# Patient Record
Sex: Female | Born: 1955 | Race: White | Hispanic: No | Marital: Married | Smoking: Former smoker
Health system: Southern US, Community
[De-identification: ages and names within clinical notes are randomized; demographics above are authoritative.]

## PROBLEM LIST (undated history)

## (undated) DIAGNOSIS — C801 Malignant (primary) neoplasm, unspecified: Secondary | ICD-10-CM

## (undated) DIAGNOSIS — E785 Hyperlipidemia, unspecified: Secondary | ICD-10-CM

## (undated) HISTORY — DX: Malignant (primary) neoplasm, unspecified: C80.1

## (undated) HISTORY — PX: ABDOMINAL HYSTERECTOMY: SHX81

## (undated) HISTORY — DX: Hyperlipidemia, unspecified: E78.5

## (undated) HISTORY — PX: JOINT REPLACEMENT: SHX530

---

## 2012-09-05 ENCOUNTER — Ambulatory Visit (INDEPENDENT_AMBULATORY_CARE_PROVIDER_SITE_OTHER): Payer: BC Managed Care – PPO | Admitting: Physician Assistant

## 2012-09-05 VITALS — BP 130/70 | HR 60 | Temp 97.7°F | Ht 71.0 in | Wt 164.0 lb

## 2012-09-05 DIAGNOSIS — J322 Chronic ethmoidal sinusitis: Secondary | ICD-10-CM

## 2012-09-05 DIAGNOSIS — R05 Cough: Secondary | ICD-10-CM

## 2012-09-05 MED ORDER — AZITHROMYCIN 250 MG PO TABS: ORAL_TABLET | ORAL | Status: DC

## 2012-09-05 MED ORDER — HYDROCODONE-HOMATROPINE 5-1.5 MG/5ML PO SYRP: 5.0000 mL | ORAL_SOLUTION | Freq: Every evening | ORAL | Status: DC | PRN

## 2012-09-05 NOTE — Progress Notes (Signed)
  Subjective:    Patient ID: Tracie Noble, female    DOB: 1956/05/29, 57 y.o.   MRN: [id number]  HPI Cough congestion x 2days   Review of Systems  HENT: Positive for congestion and sinus pressure.   Respiratory: Positive for cough.   All other systems reviewed and are negative.       Objective:   Physical Exam  Vitals reviewed. Constitutional: She is oriented to person, place, and time. She appears well-developed and well-nourished.  HENT:  Head: Normocephalic and atraumatic.  Right Ear: External ear normal.  Left Ear: External ear normal.  maxofacial tenderness, nasal hypertrophy  Eyes: Conjunctivae and EOM are normal. Pupils are equal, round, and reactive to light.  Cardiovascular: Normal rate, regular rhythm and normal heart sounds.   Pulmonary/Chest: Effort normal and breath sounds normal.  Dry cough  Neurological: She is alert and oriented to person, place, and time.  Skin: Skin is warm and dry.          Assessment & Plan:  Ethmoid sinusitis - Plan: azithromycin (ZITHROMAX) 250 MG tablet  Cough - Plan: HYDROcodone-homatropine (HYCODAN) 5-1.5 MG/5ML syrup

## 2017-08-22 ENCOUNTER — Ambulatory Visit: Payer: Managed Care, Other (non HMO) | Admitting: Family Medicine

## 2017-08-22 ENCOUNTER — Encounter: Payer: Self-pay | Admitting: Family Medicine

## 2017-08-22 VITALS — BP 113/74 | HR 69 | Temp 98.5°F | Ht 71.0 in | Wt 158.0 lb

## 2017-08-22 DIAGNOSIS — J0101 Acute recurrent maxillary sinusitis: Secondary | ICD-10-CM

## 2017-08-22 DIAGNOSIS — J040 Acute laryngitis: Secondary | ICD-10-CM

## 2017-08-22 MED ORDER — FLUTICASONE PROPIONATE 50 MCG/ACT NA SUSP: 1.0000 | Freq: Two times a day (BID) | NASAL | 6 refills | Status: DC | PRN

## 2017-08-22 MED ORDER — AMOXICILLIN 500 MG PO CAPS: 500.0000 mg | ORAL_CAPSULE | Freq: Two times a day (BID) | ORAL | 0 refills | Status: DC

## 2017-08-22 NOTE — Progress Notes (Signed)
BP 113/74   Pulse 69   Temp 98.5 F (36.9 C) (Oral)   Ht 5\' 11"  (1.803 m)   Wt 158 lb (71.7 kg)   BMI 22.04 kg/m    Subjective:    Patient ID: Tracie Noble, female    DOB: [birth year], 62 y.o.   MRN: [id number]  HPI: Tracie Noble is a 62 y.o. female presenting on 08/22/2017 for Sinusitis (sinus congestion, drainage, hoarseness; symptoms ongoing for about 2 weeks)   HPI Sinus congestion and loss of voice Patient is coming in for sinus congestion and loss of voice that has been going on for the past 2 weeks.  She normally gets sinus congestion but she does not get it as bad and with as much of a sore throat as she has currently or with the drainage that she is been having.  She denies any fevers or chills or shortness of breath or wheezing.  She does admit to a remote history of smoking but did not smoke very much.  She has been having a lot of mucus drainage and coughing and production.  She has not used anything over-the-counter because usually it just goes away on her.  When he got worse was a couple days ago and that is when she started having the sore throat really bad.  Relevant past medical, surgical, family and social history reviewed and updated as indicated. Interim medical history since our last visit reviewed. Allergies and medications reviewed and updated.  Review of Systems  Constitutional: Negative for chills and fever.  HENT: Positive for congestion, postnasal drip, rhinorrhea, sinus pressure, sneezing and sore throat. Negative for ear discharge and ear pain.   Eyes: Negative for pain, redness and visual disturbance.  Respiratory: Positive for cough. Negative for chest tightness, shortness of breath and wheezing.   Cardiovascular: Negative for chest pain and leg swelling.  Genitourinary: Negative for difficulty urinating and dysuria.  Musculoskeletal: Negative for back pain and gait problem.  Skin: Negative for rash.  Neurological: Negative for light-headedness and  headaches.  Psychiatric/Behavioral: Negative for agitation and behavioral problems.  All other systems reviewed and are negative.   Per HPI unless specifically indicated above  Social History   Socioeconomic History  . Marital status: Married    Spouse name: Not on file  . Number of children: Not on file  . Years of education: Not on file  . Highest education level: Not on file  Occupational History  . Not on file  Social Needs  . Financial resource strain: Not on file  . Food insecurity:    Worry: Not on file    Inability: Not on file  . Transportation needs:    Medical: Not on file    Non-medical: Not on file  Tobacco Use  . Smoking status: Former Smoker    Packs/day: 0.25    Years: 10.00    Pack years: 2.50    Last attempt to quit: 09/06/2002    Years since quitting: 14.9  . Smokeless tobacco: Never Used  Substance and Sexual Activity  . Alcohol use: No  . Drug use: No  . Sexual activity: Yes    Comment: married 7 years, on daughter and grankids  Lifestyle  . Physical activity:    Days per week: Not on file    Minutes per session: Not on file  . Stress: Not on file  Relationships  . Social connections:    Talks on phone: Not on file  Gets together: Not on file    Attends religious service: Not on file    Active member of club or organization: Not on file    Attends meetings of clubs or organizations: Not on file    Relationship status: Not on file  . Intimate partner violence:    Fear of current or ex partner: Not on file    Emotionally abused: Not on file    Physically abused: Not on file    Forced sexual activity: Not on file  Other Topics Concern  . Not on file  Social History Narrative  . Not on file    Past Surgical History:  Procedure Laterality Date  . ABDOMINAL HYSTERECTOMY    . JOINT REPLACEMENT     left- leg    Family History  Problem Relation Age of Onset  . Dementia Mother   . Cancer Father        lymph nodes, non hodgkins  lymphoma  . Diabetes Father     Allergies as of 08/22/2017   No Known Allergies     Medication List        Accurate as of 08/22/17 10:33 AM. Always use your most recent med list.          amoxicillin 500 MG capsule Commonly known as:  AMOXIL Take 1 capsule (500 mg total) by mouth 2 (two) times daily.   fluticasone 50 MCG/ACT nasal spray Commonly known as:  FLONASE Place 1 spray into both nostrils 2 (two) times daily as needed for allergies or rhinitis.          Objective:    BP 113/74   Pulse 69   Temp 98.5 F (36.9 C) (Oral)   Ht 5\' 11"  (1.803 m)   Wt 158 lb (71.7 kg)   BMI 22.04 kg/m   Wt Readings from Last 3 Encounters:  08/22/17 158 lb (71.7 kg)  09/05/12 164 lb (74.4 kg)    Physical Exam  Constitutional: She is oriented to person, place, and time. She appears well-developed and well-nourished. No distress.  HENT:  Right Ear: Tympanic membrane, external ear and ear canal normal.  Left Ear: Tympanic membrane, external ear and ear canal normal.  Nose: Mucosal edema and rhinorrhea present. No epistaxis. Right sinus exhibits no maxillary sinus tenderness and no frontal sinus tenderness. Left sinus exhibits no maxillary sinus tenderness and no frontal sinus tenderness.  Mouth/Throat: Uvula is midline and mucous membranes are normal. Posterior oropharyngeal edema and posterior oropharyngeal erythema present. No oropharyngeal exudate or tonsillar abscesses.  Eyes: Conjunctivae are normal.  Neck: Neck supple. No thyromegaly present.  Cardiovascular: Normal rate, regular rhythm, normal heart sounds and intact distal pulses.  No murmur heard. Pulmonary/Chest: Effort normal and breath sounds normal. No respiratory distress. She has no wheezes. She has no rales.  Musculoskeletal: Normal range of motion. She exhibits no edema or tenderness.  Lymphadenopathy:    She has no cervical adenopathy.  Neurological: She is alert and oriented to person, place, and time.  Coordination normal.  Skin: Skin is warm and dry. No rash noted. She is not diaphoretic.  Psychiatric: She has a normal mood and affect. Her behavior is normal.  Vitals reviewed.   No results found for this or any previous visit.    Assessment & Plan:   Problem List Items Addressed This Visit    None    Visit Diagnoses    Laryngitis    -  Primary   Relevant Medications   fluticasone (  FLONASE) 50 MCG/ACT nasal spray   amoxicillin (AMOXIL) 500 MG capsule   Acute recurrent maxillary sinusitis       Relevant Medications   fluticasone (FLONASE) 50 MCG/ACT nasal spray   amoxicillin (AMOXIL) 500 MG capsule       Follow up plan: Return if symptoms worsen or fail to improve.  Caryl Pina, MD [facility] 08/22/2017, 10:33 AM

## 2017-08-23 ENCOUNTER — Ambulatory Visit: Payer: Self-pay | Admitting: Family Medicine

## 2017-08-26 NOTE — Progress Notes (Signed)
Tracie Noble is a 62 y.o. female presents to office today to establish care and for annual physical exam examination.    Concerns today include: 1. None.  Has insurance form that needs to be completed.  Needs labs.  Occupation: Librarian, academic, Marital status: married, Substance use: former smoker, quit >20 years ago. Diet: regular, Exercise: regular, some structured walking Last eye exam: ? Last dental exam: ? Last colonoscopy: never.   Last mammogram: >2 years ago. No h/x abnormals Last pap smear: > 15 years ago.  She notes that she had a partial hysterectomy.  She is unsure if she still has her cervix. Refills needed today: Not on any medications. Immunizations needed: Needs TDap.  Past Medical History:  Diagnosis Date  . Cancer ([place])    cervical at age 22    Social History   Socioeconomic History  . Marital status: Married    Spouse name: Not on file  . Number of children: Not on file  . Years of education: Not on file  . Highest education level: Not on file  Occupational History  . Not on file  Social Needs  . Financial resource strain: Not on file  . Food insecurity:    Worry: Not on file    Inability: Not on file  . Transportation needs:    Medical: Not on file    Non-medical: Not on file  Tobacco Use  . Smoking status: Former Smoker    Packs/day: 0.25    Years: 10.00    Pack years: 2.50    Last attempt to quit: 09/06/2002    Years since quitting: 14.9  . Smokeless tobacco: Never Used  Substance and Sexual Activity  . Alcohol use: No  . Drug use: No  . Sexual activity: Yes    Comment: married 7 years, on daughter and grankids  Lifestyle  . Physical activity:    Days per week: Not on file    Minutes per session: Not on file  . Stress: Not on file  Relationships  . Social connections:    Talks on phone: Not on file    Gets together: Not on file    Attends religious service: Not on file    Active member of club or organization: Not on file    Attends  meetings of clubs or organizations: Not on file    Relationship status: Not on file  . Intimate partner violence:    Fear of current or ex partner: Not on file    Emotionally abused: Not on file    Physically abused: Not on file    Forced sexual activity: Not on file  Other Topics Concern  . Not on file  Social History Narrative  . Not on file   Past Surgical History:  Procedure Laterality Date  . ABDOMINAL HYSTERECTOMY    . JOINT REPLACEMENT     left- leg   Family History  Problem Relation Age of Onset  . Dementia Mother   . Cancer Father        lymph nodes, non hodgkins lymphoma  . Diabetes Father     Current Outpatient Medications:  .  amoxicillin (AMOXIL) 500 MG capsule, Take 1 capsule (500 mg total) by mouth 2 (two) times daily., Disp: 14 capsule, Rfl: 0 .  fluticasone (FLONASE) 50 MCG/ACT nasal spray, Place 1 spray into both nostrils 2 (two) times daily as needed for allergies or rhinitis., Disp: 16 g, Rfl: 6   ROS: Review of Systems Constitutional:  negative Eyes: positive for contacts/glasses Ears, nose, mouth, throat, and face: negative Respiratory: negative Cardiovascular: negative Gastrointestinal: negative Genitourinary:negative Integument/breast: negative Hematologic/lymphatic: negative Musculoskeletal:negative Neurological: negative Behavioral/Psych: negative Endocrine: negative Allergic/Immunologic: negative    Physical exam BP 108/63   Pulse 69   Temp 99.3 F (37.4 C) (Oral)   Ht '5\' 11"'  (1.803 m)   Wt 160 lb (72.6 kg)   BMI 22.32 kg/m  General appearance: alert, cooperative, appears stated age and no distress Head: Normocephalic, without obvious abnormality, atraumatic Eyes: negative findings: lids and lashes normal, conjunctivae and sclerae normal, corneas clear and pupils equal, round, reactive to light and accomodation Ears: normal TM's and external ear canals both ears Nose: Nares normal. Septum midline. Mucosa normal. No drainage or  sinus tenderness. Throat: lips, mucosa, and tongue normal; teeth and gums normal Neck: no adenopathy, no carotid bruit, no JVD, supple, symmetrical, trachea midline and thyroid not enlarged, symmetric, no tenderness/mass/nodules Back: symmetric, no curvature. ROM normal. No CVA tenderness. Lungs: clear to auscultation bilaterally Breasts: normal appearance, no masses or tenderness Heart: regular rate and rhythm, S1, S2 normal, no murmur, click, rub or gallop Abdomen: soft, non-tender; bowel sounds normal; no masses,  no organomegaly Pelvic: external genitalia normal, no adnexal masses or tenderness, rectovaginal septum normal, uterus normal size, shape, and consistency, vagina normal without discharge and Cervix not well visualized.  It was not palpated on bimanual. Extremities: extremities normal, atraumatic, no cyanosis or edema Pulses: 2+ and symmetric Skin: Skin color, texture, turgor normal. No rashes or lesions Lymph nodes: Cervical, supraclavicular, and axillary nodes normal. Neurologic: Grossly normal    Assessment/ Plan: Tracie Noble here for annual physical exam.   1. Well woman exam with routine gynecological exam We discussed colon cancer screening and mammography today.  Patient will schedule mammogram.  She is unsure as to what she wants to do for colon cancer screening.  I offered her Cologuard versus referral to GI for colonoscopy.  She will contact me when she is made a decision. - IGP, Aptima HPV, rfx 16/18,45  2. History of partial hysterectomy Cervix was not visualized on exam today nor was it well palpated.  I am not sure that she even has a cervix still present.  I performed a Pap smear today.  If no cervical cells are noted, may consider discontinuing cervical cancer screening. - IGP, Aptima HPV, rfx 16/18,45  3. Screening for cervical cancer - IGP, Aptima HPV, rfx 16/18,45  4. Screening for metabolic disorder - [id number]  5. Screening for deficiency  anemia - CBC with Differential  6. Screening for lipid disorders - Lipid Panel   Counseled on healthy lifestyle choices, including diet (rich in fruits, vegetables and lean meats and low in salt and simple carbohydrates) and exercise (at least 30 minutes of moderate physical activity daily).  Patient to follow up in 1 year for annual exam or sooner if needed.  Ashly M. Lajuana Ripple, DO

## 2017-08-30 ENCOUNTER — Encounter: Payer: Self-pay | Admitting: Family Medicine

## 2017-08-30 ENCOUNTER — Ambulatory Visit (INDEPENDENT_AMBULATORY_CARE_PROVIDER_SITE_OTHER): Payer: Managed Care, Other (non HMO) | Admitting: Family Medicine

## 2017-08-30 VITALS — BP 108/63 | HR 69 | Temp 99.3°F | Ht 71.0 in | Wt 160.0 lb

## 2017-08-30 DIAGNOSIS — Z01419 Encounter for gynecological examination (general) (routine) without abnormal findings: Secondary | ICD-10-CM

## 2017-08-30 DIAGNOSIS — Z90711 Acquired absence of uterus with remaining cervical stump: Secondary | ICD-10-CM

## 2017-08-30 DIAGNOSIS — Z13228 Encounter for screening for other metabolic disorders: Secondary | ICD-10-CM

## 2017-08-30 DIAGNOSIS — Z130 Encounter for screening for diseases of the blood and blood-forming organs and certain disorders involving the immune mechanism: Secondary | ICD-10-CM

## 2017-08-30 DIAGNOSIS — Z23 Encounter for immunization: Secondary | ICD-10-CM | POA: Diagnosis not present

## 2017-08-30 DIAGNOSIS — Z13220 Encounter for screening for lipoid disorders: Secondary | ICD-10-CM

## 2017-08-30 DIAGNOSIS — Z124 Encounter for screening for malignant neoplasm of cervix: Secondary | ICD-10-CM

## 2017-08-30 NOTE — Patient Instructions (Signed)
You had labs performed today.  You will be contacted with the results of the labs once they are available, usually in the next 3 days for routine lab work.  We discussed: Colonoscopy (direct visualization of colon) and Cologuard (looks for DNA of colon cancer and has to be done every 3 years).  Schedule your mammogram before you leave today.  Colorectal Cancer Screening Colorectal cancer screening is a group of tests used to check for colorectal cancer. Colorectal refers to your colon and rectum. Your colon and rectum are located at the end of your large intestine and carry your bowel movements out of your body. Why is colorectal cancer screening done? It is common for abnormal growths (polyps) to form in the lining of your colon, especially as you get older. These polyps can be cancerous or become cancerous. If colorectal cancer is found at an early stage, it is treatable. Who should be screened for colorectal cancer? Screening is recommended for all adults at average risk starting at age 28. Tests may be recommended every 1 to 10 years. Your health care provider may recommend earlier or more frequent screening if you have:  A history of colorectal cancer or polyps.  A family member with a history of colorectal cancer or polyps.  Inflammatory bowel disease, such as ulcerative colitis or Crohn disease.  A type of hereditary colon cancer syndrome.  Colorectal cancer symptoms.  Types of screening tests There are several types of colorectal screening tests. They include:  Guaiac-based fecal occult blood testing.  Fecal immunochemical test (FIT).  Stool DNA test.  Barium enema.  Virtual colonoscopy.  Sigmoidoscopy. During this test, a sigmoidoscope is used to examine your rectum and lower colon. A sigmoidoscope is a flexible tube with a camera that is inserted through your anus into your rectum and lower colon.  Colonoscopy. During this test, a colonoscope is used to examine your  entire colon. A colonoscope is a long, thin, flexible tube with a camera. This test examines your entire colon and rectum.  This information is not intended to replace advice given to you by your health care provider. Make sure you discuss any questions you have with your health care provider. Document Released: 11/08/2009 Document Revised: 12/29/2015 Document Reviewed: 08/27/2013 Elsevier Interactive Patient Education  [employer].

## 2017-08-31 LAB — CBC WITH DIFFERENTIAL/PLATELET
BASOS: 1 %
Basophils Absolute: 0 10*3/uL (ref 0.0–0.2)
EOS (ABSOLUTE): 0.3 10*3/uL (ref 0.0–0.4)
Eos: 5 %
HEMATOCRIT: 37.6 % (ref 34.0–46.6)
Hemoglobin: 12.4 g/dL (ref 11.1–15.9)
IMMATURE GRANS (ABS): 0 10*3/uL (ref 0.0–0.1)
Immature Granulocytes: 0 %
LYMPHS: 36 %
Lymphocytes Absolute: 2 10*3/uL (ref 0.7–3.1)
MCH: 29.2 pg (ref 26.6–33.0)
MCHC: 33 g/dL (ref 31.5–35.7)
MCV: 89 fL (ref 79–97)
MONOS ABS: 0.5 10*3/uL (ref 0.1–0.9)
Monocytes: 9 %
Neutrophils Absolute: 2.7 10*3/uL (ref 1.4–7.0)
Neutrophils: 49 %
PLATELETS: 212 10*3/uL (ref 150–379)
RBC: 4.25 x10E6/uL (ref 3.77–5.28)
RDW: 13.7 % (ref 12.3–15.4)
WBC: 5.5 10*3/uL (ref 3.4–10.8)

## 2017-08-31 LAB — CMP14+EGFR
A/G RATIO: 1.8 (ref 1.2–2.2)
ALT: 17 IU/L (ref 0–32)
AST: 17 IU/L (ref 0–40)
Albumin: 4.6 g/dL (ref 3.5–5.5)
Alkaline Phosphatase: 95 IU/L (ref 39–117)
BUN/Creatinine Ratio: 16 (ref 9–23)
BUN: 12 mg/dL (ref 6–24)
Bilirubin Total: 0.3 mg/dL (ref 0.0–1.2)
CALCIUM: 9.7 mg/dL (ref 8.7–10.2)
CO2: 27 mmol/L (ref 20–29)
Chloride: 102 mmol/L (ref 96–106)
Creatinine, Ser: 0.75 mg/dL (ref 0.57–1.00)
GFR, EST AFRICAN AMERICAN: 101 mL/min/{1.73_m2} (ref >59)
GFR, EST NON AFRICAN AMERICAN: 88 mL/min/{1.73_m2} (ref >59)
GLOBULIN, TOTAL: 2.5 g/dL (ref 1.5–4.5)
Glucose: 79 mg/dL (ref 65–99)
POTASSIUM: 3.9 mmol/L (ref 3.5–5.2)
SODIUM: 143 mmol/L (ref 134–144)
TOTAL PROTEIN: 7.1 g/dL (ref 6.0–8.5)

## 2017-08-31 LAB — LIPID PANEL
Chol/HDL Ratio: 2.6 ratio (ref 0.0–4.4)
Cholesterol, Total: 190 mg/dL (ref 100–199)
HDL: 72 mg/dL (ref >39)
LDL Calculated: 106 mg/dL — ABNORMAL HIGH (ref 0–99)
TRIGLYCERIDES: 58 mg/dL (ref 0–149)
VLDL Cholesterol Cal: 12 mg/dL (ref 5–40)

## 2017-09-02 ENCOUNTER — Telehealth: Payer: Self-pay | Admitting: Family Medicine

## 2017-09-02 NOTE — Telephone Encounter (Signed)
Pt notified provider has not reviewed labs

## 2017-09-03 LAB — IGP, APTIMA HPV, RFX 16/18,45
HPV Aptima: NEGATIVE
PAP SMEAR COMMENT: 0

## 2017-10-01 LAB — HM MAMMOGRAPHY

## 2017-10-25 ENCOUNTER — Encounter: Payer: Self-pay | Admitting: Family Medicine

## 2017-10-25 ENCOUNTER — Ambulatory Visit: Payer: Managed Care, Other (non HMO) | Admitting: Family Medicine

## 2017-10-25 VITALS — BP 117/70 | HR 67 | Temp 98.2°F | Ht 71.0 in | Wt 156.0 lb

## 2017-10-25 DIAGNOSIS — M25561 Pain in right knee: Secondary | ICD-10-CM

## 2017-10-25 MED ORDER — DICLOFENAC SODIUM 75 MG PO TBEC: 75.0000 mg | DELAYED_RELEASE_TABLET | Freq: Two times a day (BID) | ORAL | 0 refills | Status: DC | PRN

## 2017-10-25 NOTE — Progress Notes (Addendum)
Subjective: CC: knee pain PCP: Janora Norlander, DO BPZ:WCHENI Tracie Noble is a 62 y.o. female presenting to clinic today for:  1. Knee pain Patient reports acute onset of right medial knee pain that started on Monday.  She describes the pain as sharp and exacerbated by bending the knee.  She states that there was no preceding injury but that she was on her hands and knees doing yard work Saturday and Sunday.  She notes associated swelling.  No discoloration.  No locking, popping, numbness or tingling.  No weakness.  She has not had any preceding injuries or surgeries to the right knee.  She did use oral analgesics on Monday but discontinued.  She has been using Biofreeze intermittently with some improvements but not substantial improvement.   ROS: Per HPI  No Known Allergies Past Medical History:  Diagnosis Date  . Cancer ([place])    cervical at age 65     Current Outpatient Medications:  .  fluticasone (FLONASE) 50 MCG/ACT nasal spray, Place 1 spray into both nostrils 2 (two) times daily as needed for allergies or rhinitis., Disp: 16 g, Rfl: 6 Social History   Socioeconomic History  . Marital status: Married    Spouse name: Not on file  . Number of children: Not on file  . Years of education: Not on file  . Highest education level: Not on file  Occupational History  . Not on file  Social Needs  . Financial resource strain: Not on file  . Food insecurity:    Worry: Not on file    Inability: Not on file  . Transportation needs:    Medical: Not on file    Non-medical: Not on file  Tobacco Use  . Smoking status: Former Smoker    Packs/day: 0.25    Years: 10.00    Pack years: 2.50    Last attempt to quit: 09/06/2002    Years since quitting: 15.1  . Smokeless tobacco: Never Used  Substance and Sexual Activity  . Alcohol use: No  . Drug use: No  . Sexual activity: Yes    Comment: married 7 years, on daughter and grankids  Lifestyle  . Physical activity:    Days per week:  Not on file    Minutes per session: Not on file  . Stress: Not on file  Relationships  . Social connections:    Talks on phone: Not on file    Gets together: Not on file    Attends religious service: Not on file    Active member of club or organization: Not on file    Attends meetings of clubs or organizations: Not on file    Relationship status: Not on file  . Intimate partner violence:    Fear of current or ex partner: Not on file    Emotionally abused: Not on file    Physically abused: Not on file    Forced sexual activity: Not on file  Other Topics Concern  . Not on file  Social History Narrative  . Not on file   Family History  Problem Relation Age of Onset  . Dementia Mother   . Cancer Father        lymph nodes, non hodgkins lymphoma  . Diabetes Father     Objective: Office vital signs reviewed. BP 117/70 (BP Location: Left Arm)   Pulse 67   Temp 98.2 F (36.8 C) (Oral)   Ht 5\' 11"  (1.803 m)   Wt 156 lb (  70.8 kg)   BMI 21.76 kg/m   Physical Examination:  General: Awake, alert, well nourished, No acute distress Extremities: warm, well perfused, No edema, cyanosis or clubbing; +2 pulses bilaterally MSK: normal gait and normal station  Right knee: She has about a 5 degree loss of active range of motion extension and 10 degree loss of active range of motion in flexion secondary to pain and swelling.  There is a mild joint effusion appreciated.  No gross erythema or discoloration.  There is no tenderness to palpation to the patella, patellar tendon or quad tendon.  She does have mild tenderness to palpation along the medial joint line.  No tenderness to palpation or palpable masses within the posterior popliteal fossa.  No ligamentous laxity.  She does have some discomfort with Thessaly maneuver. Skin: dry; intact; no rashes or lesions Neuro: 5/5 LE Strength and light touch sensation grossly intact  Assessment/ Plan: 62 y.o. female   1. Acute pain of right knee I  suspect that she likely has a small meniscal injury given positive Thessaly, associated swelling.  We discussed the options and patient would like to proceed with conservative treatment.  Voltaren 75 mg p.o. twice daily as needed pain with a meal and plenty of water prescribed.  I recommended that she use this at least for the next for 5 days to reduce swelling and pain.  Recommended,  ice, compression and elevation of the lower extremity.  Avoid exacerbating activities.  If pain persists or worsens, will plan for imaging and referral.  Home care instructions reviewed.  Reasons for return discussed.  Follow-up as needed.    Meds ordered this encounter  Medications  . diclofenac (VOLTAREN) 75 MG EC tablet    Sig: Take 1 tablet (75 mg total) by mouth 2 (two) times daily as needed for moderate pain.    Dispense:  60 tablet    Refill:  [place], DO [place] [phone]

## 2017-10-25 NOTE — Patient Instructions (Signed)
I think that you have injured your meniscus.  I have prescribed you an oral anti-inflammatory to take up to twice daily if needed for pain and swelling.  We discussed taking this with food and plenty of water.  If your symptoms are persisting or if they are getting worse, please return for reevaluation.  We may need to consider referral to orthopedics at that time.  Continue to elevate the left leg, use of brace and apply ice.  You have prescribed a nonsteroidal anti-inflammatory drug (NSAID) today. This will help with ear pain and inflammation. Please do not take any other NSAIDs (ibuprofen/Motrin/Advil, naproxen/Aleve, meloxicam/Mobic, Voltaren/diclofenac). Please make sure to eat a meal when taking this medication.   Caution:  If you have a history of acid reflux/indigestion, I recommend that you take an antacid (such as Prilosec, Prevacid) daily while on the NSAID.  If you have a history of bleeding disorder, gastric ulcer, are on a blood thinner (like warfarin/Coumadin, Xarelto, Eliquis, etc) please do not take NSAID.  If you have ever had a heart attack, you should not take NSAIDs.    Meniscus Tear A meniscus tear is a knee injury in which a piece of the meniscus is torn. The meniscus is a thick, rubbery, wedge-shaped cartilage in the knee. Two menisci are located in each knee. They sit between the upper bone (femur) and lower bone (tibia) that make up the knee joint. Each meniscus acts as a shock absorber for the knee. A torn meniscus is one of the most common types of knee injuries. This injury can range from mild to severe. Surgery may be needed for a severe tear. What are the causes? This injury may be caused by any squatting, twisting, or pivoting movement. Sports-related injuries are the most common cause. These often occur from:  Running and stopping suddenly.  Changing direction.  Being tackled or knocked off your feet.  As people get older, their meniscus gets thinner and  weaker. In these people, tears can happen more easily, such as from climbing stairs. What increases the risk? This injury is more likely to happen to:  People who play contact sports.  Males.  People who are 23?60 years of age.  What are the signs or symptoms? Symptoms of this injury include:  Knee pain, especially at the side of the knee joint. You may feel pain when the injury occurs, or you may only hear a pop and feel pain later.  A feeling that your knee is clicking, catching, locking, or giving way.  Not being able to fully bend or extend your knee.  Bruising or swelling in your knee.  How is this diagnosed? This injury may be diagnosed based on your symptoms and a physical exam. The physical exam may include:  Moving your knee in different ways.  Feeling for tenderness.  Listening for a clicking sound.  Checking if your knee locks or catches.  You may also have tests, such as:  X-rays.  MRI.  A procedure to look inside your knee with a narrow surgical telescope (arthroscopy).  You may be referred to a knee specialist (orthopedic surgeon). How is this treated? Treatment for this injury depends on the severity of the tear. Treatment for a mild tear may include:  Rest.  Medicine to reduce pain and swelling. This is usually a nonsteroidal anti-inflammatory drug (NSAID).  A knee brace or an elastic sleeve or wrap.  Using crutches or a walker to keep weight off your knee and to  help you walk.  Exercises to strengthen your knee (physical therapy).  You may need surgery if you have a severe tear or if other treatments are not working. Follow these instructions at home: Managing pain and swelling  Take over-the-counter and prescription medicines only as told by your health care provider.  If directed, apply ice to the injured area: ? Put ice in a plastic bag. ? Place a towel between your skin and the bag. ? Leave the ice on for 20 minutes, 2-3 times per  day.  Raise (elevate) the injured area above the level of your heart while you are sitting or lying down. Activity  Do not use the injured limb to support your body weight until your health care provider says that you can. Use crutches or a walker as told by your health care provider.  Return to your normal activities as told by your health care provider. Ask your health care provider what activities are safe for you.  Perform range-of-motion exercises only as told by your health care provider.  Begin doing exercises to strengthen your knee and leg muscles only as told by your health care provider. After you recover, your health care provider may recommend these exercises to help prevent another injury. General instructions  Use a knee brace or elastic wrap as told by your health care provider.  Keep all follow-up visits as told by your health care provider. This is important. Contact a health care provider if:  You have a fever.  Your knee becomes red, tender, or swollen.  Your pain medicine is not helping.  Your symptoms get worse or do not improve after 2 weeks of home care. This information is not intended to replace advice given to you by your health care provider. Make sure you discuss any questions you have with your health care provider. Document Released: 08/11/2002 Document Revised: 10/27/2015 Document Reviewed: 09/13/2014 Elsevier Interactive Patient Education  [employer].

## 2017-11-04 ENCOUNTER — Encounter: Payer: Self-pay | Admitting: Family Medicine

## 2017-11-04 ENCOUNTER — Ambulatory Visit (INDEPENDENT_AMBULATORY_CARE_PROVIDER_SITE_OTHER): Payer: Managed Care, Other (non HMO)

## 2017-11-04 ENCOUNTER — Ambulatory Visit: Payer: Managed Care, Other (non HMO) | Admitting: Family Medicine

## 2017-11-04 VITALS — BP 134/82 | HR 74 | Temp 98.2°F | Ht 71.0 in | Wt 157.0 lb

## 2017-11-04 DIAGNOSIS — M25561 Pain in right knee: Secondary | ICD-10-CM

## 2017-11-04 DIAGNOSIS — R52 Pain, unspecified: Secondary | ICD-10-CM

## 2017-11-04 NOTE — Progress Notes (Signed)
Subjective: CC: persistent knee pain PCP: Janora Norlander, DO UJW:JXBJYN Tracie Noble is a 62 y.o. female presenting to clinic today for:  1. Knee pain Patient was seen 10/25/2017 for acute right knee pain.  This was thought to be possibly meniscal in nature.  She was started on Voltaren p.o. twice daily as needed pain.  She was instructed to follow-up in clinic if symptoms persisted or worsened, plans for imaging and referral.  She follows up in office today and notes that pain has not really changed much.  She does state that pain did seem to be better when she was taking the Voltaren but she discontinued it after about 4 days to see if "she could do it on her own".  Pain is now back to what it was before, perhaps slightly better than previous.  She notes that she continues to have pain with right knee flexion.  Denies any substantial swelling, redness.  She follows up today to make sure that there is nothing else she needs to be concerned about because she has a trip coming up soon.   ROS: Per HPI  No Known Allergies Past Medical History:  Diagnosis Date  . Cancer (HCC)    cervical at age 84     Current Outpatient Medications:  .  diclofenac (VOLTAREN) 75 MG EC tablet, Take 1 tablet (75 mg total) by mouth 2 (two) times daily as needed for moderate pain., Disp: 60 tablet, Rfl: 0 .  fluticasone (FLONASE) 50 MCG/ACT nasal spray, Place 1 spray into both nostrils 2 (two) times daily as needed for allergies or rhinitis., Disp: 16 g, Rfl: 6 Social History   Socioeconomic History  . Marital status: Married    Spouse name: Not on file  . Number of children: Not on file  . Years of education: Not on file  . Highest education level: Not on file  Occupational History  . Not on file  Social Needs  . Financial resource strain: Not on file  . Food insecurity:    Worry: Not on file    Inability: Not on file  . Transportation needs:    Medical: Not on file    Non-medical: Not on file    Tobacco Use  . Smoking status: Former Smoker    Packs/day: 0.25    Years: 10.00    Pack years: 2.50    Last attempt to quit: 09/06/2002    Years since quitting: 15.1  . Smokeless tobacco: Never Used  Substance and Sexual Activity  . Alcohol use: No  . Drug use: No  . Sexual activity: Yes    Comment: married 7 years, on daughter and grankids  Lifestyle  . Physical activity:    Days per week: Not on file    Minutes per session: Not on file  . Stress: Not on file  Relationships  . Social connections:    Talks on phone: Not on file    Gets together: Not on file    Attends religious service: Not on file    Active member of club or organization: Not on file    Attends meetings of clubs or organizations: Not on file    Relationship status: Not on file  . Intimate partner violence:    Fear of current or ex partner: Not on file    Emotionally abused: Not on file    Physically abused: Not on file    Forced sexual activity: Not on file  Other Topics Concern  .  Not on file  Social History Narrative  . Not on file   Family History  Problem Relation Age of Onset  . Dementia Mother   . Cancer Father        lymph nodes, non hodgkins lymphoma  . Diabetes Father     Objective: Office vital signs reviewed. BP 134/82   Pulse 74   Temp 98.2 F (36.8 C) (Oral)   Ht 5\' 11"  (1.803 m)   Wt 157 lb (71.2 kg)   BMI 21.90 kg/m   Physical Examination:  General: Awake, alert, well nourished, No acute distress Extremities: warm, well perfused, No edema, cyanosis or clubbing; +2 pulses bilaterally MSK: normal gait and normal station  Right knee: Patient has full active range of motion but does have some discomfort with flexion.  No appreciable effusion, erythema.  She has no tenderness to palpation to the patella, patellar tendon or quad tendon.  No tenderness to palpation to the joint line.  No palpable posterior popliteal abnormalities.  Minimal discomfort with Thessaly maneuver. Skin:  dry; intact; no rashes or lesions Neuro: Light touch sensation grossly intact.  Dg Knee 1-2 Views Right  Result Date: 11/04/2017 CLINICAL DATA:  Knee pain EXAM: RIGHT KNEE - 2 VIEW COMPARISON:  None. FINDINGS: Mild medial joint space narrowing is noted. Minimal patellofemoral changes are seen. No joint effusion or acute fracture is noted. IMPRESSION: Mild degenerative change without acute abnormality. Electronically Signed   By: Inez Catalina M.D.   On: 11/04/2017 16:23   Assessment/ Plan: 62 y.o. female   1. Acute pain of right knee X-ray was obtained and personal review demonstrated mild medial joint space narrowing on the right.  Her physical exam was remarkable for mild pain with flexion of the right knee and mild discomfort with Thessaly maneuver.  We discussed that she should continue the Voltaren, icing and bracing.  Avoid exacerbating activities.  We discussed consideration for referral to orthopedics for direct visualization of the meniscus under ultrasound.  Patient wished to hold off on this for a little bit longer since she did have some improvement with Voltaren.  She will resume use of the Voltaren and contact me within about a week or so to discuss whether or not she would like to proceed with orthopedic referral.  In the meantime, she will proceed with home physical therapy for presumed meniscal injury. - DG Knee 1-2 Views Right; Future   Orders Placed This Encounter  Procedures  . DG Knee 1-2 Views Right    Standing Status:   Future    Number of Occurrences:   1    Standing Expiration Date:   01/04/2019    Order Specific Question:   Reason for Exam (SYMPTOM  OR DIAGNOSIS REQUIRED)    Answer:   pain    Order Specific Question:   Is the patient pregnant?    Answer:   No    Order Specific Question:   Preferred imaging location?    Answer:   Internal      Janora Norlander, [place] [phone]

## 2017-11-04 NOTE — Patient Instructions (Signed)
Your xray did show some narrowing of the joint on the right.  I recommend resuming use of Diclofenac.  Continue ice.  I have given you physical therapy to do.  If your symptoms persist or worsen, we can consider having you see a specialist.   Knee Pain, Adult Many things can cause knee pain. The pain often goes away on its own with time and rest. If the pain does not go away, tests may be done to find out what is causing the pain. Follow these instructions at home: Activity  Rest your knee.  Do not do things that cause pain.  Avoid activities where both feet leave the ground at the same time (high-impact activities). Examples are running, jumping rope, and doing jumping jacks. General instructions  Take medicines only as told by your doctor.  Raise (elevate) your knee when you are resting. Make sure your knee is higher than your heart.  Sleep with a pillow under your knee.  If told, put ice on the knee: ? Put ice in a plastic bag. ? Place a towel between your skin and the bag. ? Leave the ice on for 20 minutes, 2-3 times a day.  Ask your doctor if you should wear an elastic knee support.  Lose weight if you are overweight. Being overweight can make your knee hurt more.  Do not use any tobacco products. These include cigarettes, chewing tobacco, or electronic cigarettes. If you need help quitting, ask your doctor. Smoking may slow down healing. Contact a doctor if:  The pain does not stop.  The pain changes or gets worse.  You have a fever along with knee pain.  Your knee gives out or locks up.  Your knee swells, and becomes worse. Get help right away if:  Your knee feels warm.  You cannot move your knee.  You have very bad knee pain.  You have chest pain.  You have trouble breathing. Summary  Many things can cause knee pain. The pain often goes away on its own with time and rest.  Avoid activities that put stress on your knee. These include running and jumping  rope.  Get help right away if you cannot move your knee, or if your knee feels warm, or if you have trouble breathing. This information is not intended to replace advice given to you by your health care provider. Make sure you discuss any questions you have with your health care provider. Document Released: 08/17/2008 Document Revised: 05/15/2016 Document Reviewed: 05/15/2016 Elsevier Interactive Patient Education  2017 [employer].

## 2017-11-11 ENCOUNTER — Telehealth: Payer: Self-pay | Admitting: Family Medicine

## 2017-11-11 NOTE — Telephone Encounter (Signed)
How wonderful.  I am glad to hear this!

## 2017-12-20 ENCOUNTER — Ambulatory Visit: Payer: Managed Care, Other (non HMO) | Admitting: Family Medicine

## 2017-12-20 ENCOUNTER — Encounter: Payer: Self-pay | Admitting: Family Medicine

## 2017-12-20 VITALS — BP 122/77 | HR 69 | Temp 98.5°F | Ht 71.0 in | Wt 158.0 lb

## 2017-12-20 DIAGNOSIS — M25561 Pain in right knee: Secondary | ICD-10-CM

## 2017-12-20 DIAGNOSIS — M1711 Unilateral primary osteoarthritis, right knee: Secondary | ICD-10-CM

## 2017-12-20 NOTE — Progress Notes (Signed)
Subjective: CC: right knee pain PCP: Janora Norlander, DO RCV:Tracie Noble is a 62 y.o. female presenting to clinic today for:  1. Right knee pain Patient has been having right knee pain since Oct 25, 2017.  Pain was described as sharp and worsened by flexing the knee.  There was no preceding injury but she noted that she been on her hands and knees doing yard work prior to onset.  It was thought that the pain was related to possible meniscal injury.  She started on Voltaren, provided home exercises to perform and x-rays were obtained which did demonstrate mild degenerative changes within the knee.  Pain did seem to improve some but has started becoming worse again.  Today, she follows up and is now willing to see an orthopedist for issue.  She has not taken the Voltaren recently but is willing to do so since it helped in the past.  Denies any falls, lower extreme any numbness or tingling.  She does feel like the knee is swelling some.   ROS: Per HPI  No Known Allergies Past Medical History:  Diagnosis Date  . Cancer ([facility])    cervical at age 86    No current outpatient medications on file. Social History   Socioeconomic History  . Marital status: Married    Spouse name: Not on file  . Number of children: Not on file  . Years of education: Not on file  . Highest education level: Not on file  Occupational History  . Not on file  Social Needs  . Financial resource strain: Not on file  . Food insecurity:    Worry: Not on file    Inability: Not on file  . Transportation needs:    Medical: Not on file    Non-medical: Not on file  Tobacco Use  . Smoking status: Former Smoker    Packs/day: 0.25    Years: 10.00    Pack years: 2.50    Last attempt to quit: 09/06/2002    Years since quitting: 15.2  . Smokeless tobacco: Never Used  Substance and Sexual Activity  . Alcohol use: No  . Drug use: No  . Sexual activity: Yes    Comment: married 7 years, on daughter and grankids    Lifestyle  . Physical activity:    Days per week: Not on file    Minutes per session: Not on file  . Stress: Not on file  Relationships  . Social connections:    Talks on phone: Not on file    Gets together: Not on file    Attends religious service: Not on file    Active member of club or organization: Not on file    Attends meetings of clubs or organizations: Not on file    Relationship status: Not on file  . Intimate partner violence:    Fear of current or ex partner: Not on file    Emotionally abused: Not on file    Physically abused: Not on file    Forced sexual activity: Not on file  Other Topics Concern  . Not on file  Social History Narrative  . Not on file   Family History  Problem Relation Age of Onset  . Dementia Mother   . Cancer Father        lymph nodes, non hodgkins lymphoma  . Diabetes Father     Objective: Office vital signs reviewed. BP 122/77   Pulse 69   Temp 98.5 F (  36.9 C) (Oral)   Ht 5\' 11"  (1.803 m)   Wt 158 lb (71.7 kg)   BMI 22.04 kg/m   Physical Examination:  General: Awake, alert, well nourished, No acute distress Extremities: warm, well perfused, No edema, cyanosis or clubbing; +2 pulses bilaterally MSK: normal gait and normal station  Right knee: Patient has full active range of motion but does have some pain with pronounced flexion.  Knee appears to be slightly swollen.  No increased warmth or significant erythema.  No discoloration.  No palpable bony abnormalities.  She does have tenderness to palpation along the medial joint line.  No palpable posterior popliteal masses.  No ligamentous laxity. Skin: dry; intact; no rashes or lesions Neuro: 5/5 LE Strength and light touch sensation grossly intact   EXAM: RIGHT KNEE - 2 VIEW  COMPARISON:  None.  FINDINGS: Mild medial joint space narrowing is noted. Minimal patellofemoral changes are seen. No joint effusion or acute fracture is noted.  IMPRESSION: Mild degenerative  change without acute abnormality.   Electronically Signed   By: Inez Catalina M.D.   On: 11/04/2017 16:23    Assessment/ Plan: 62 y.o. female   1. Acute pain of right knee Since she is failing conservative therapies, I agree that referral to orthopedics would be a good idea.  Perhaps he can visualize the meniscus under ultrasound.  For now, resume use of Voltaren, rice therapies.  Follow-up as needed. - Ambulatory referral to Orthopedic Surgery  2. Primary osteoarthritis of right knee   Orders Placed This Encounter  Procedures  . Ambulatory referral to Orthopedic Surgery    Referral Priority:   Routine    Referral Type:   Surgical    Referral Reason:   Specialty Services Required    Requested Specialty:   Orthopedic Surgery    Number of Visits Requested:   [place], [place] [phone]

## 2017-12-20 NOTE — Patient Instructions (Addendum)
Referral has been placed. I will send in the xray results.  [name] Address: [address], [place], [place] [address] Phone: [phone]

## 2018-03-03 ENCOUNTER — Ambulatory Visit: Payer: Managed Care, Other (non HMO) | Admitting: Family Medicine

## 2018-03-03 ENCOUNTER — Encounter: Payer: Self-pay | Admitting: Family Medicine

## 2018-03-03 VITALS — BP 120/84 | HR 85 | Temp 97.1°F | Ht 71.0 in | Wt 156.0 lb

## 2018-03-03 DIAGNOSIS — G8929 Other chronic pain: Secondary | ICD-10-CM | POA: Diagnosis not present

## 2018-03-03 DIAGNOSIS — M25561 Pain in right knee: Secondary | ICD-10-CM

## 2018-03-03 DIAGNOSIS — Z01818 Encounter for other preprocedural examination: Secondary | ICD-10-CM | POA: Diagnosis not present

## 2018-03-03 NOTE — Progress Notes (Signed)
Pt is a 62 y.o. female who is here for preoperative clearance for right partial knee replacement w/ Tracie Noble.  Date TBD.  She denies any chest pain, shortness of breath, change in exercise tolerance.  She continues to have right-sided knee pain that is worse with flexion of the knee.  She notes that this is refractory to corticosteroid injection and over-the-counter medications.  1) High Risk Cardiac Conditions  1) Recent MI - No.  2) Decompensated Heart Failure - No.  3) Unstable angina - No.  4) Symptomatic arrythmia - No.  5) Sx Valvular Disease - No.  2) Intermediate Risk Factors - DM, CKD, CVA, CHF, CAD - No.  2) Functional Status - > 4 mets (Walk, run, climb stairs) Yes.  Tracie Noble Activity Status Index: 53.7  3) Surgery Specific Risk -      Intermediate (Carotid, Head and Neck, Orthopaedic )          4) Further Noninvasive evaluation -   1) EKG - No.   1) Hx of CVA, CAD, DM, CKD  2) Echo - No.   1) Worsening dyspnea   3) Stress Testing - Active Cardiac Disease - No.  5) Need for medical therapy - Beta Blocker, Statins indicated ? No.  PE: Vitals:   03/03/18 1128  BP: 120/84  Pulse: 85  Temp: (!) 97.1 F (36.2 C)   Physical Examination: General appearance - alert, well appearing, and in no distress, oriented to person, place, and time, normal appearing weight and well hydrated Mental status - alert, oriented to person, place, and time Eyes - pupils equal and reactive, extraocular eye movements intact, sclera anicteric Mouth - mucous membranes moist, pharynx normal without lesions and Mallampati 2 Neck - supple, no significant adenopathy, has full AROM Chest - clear to auscultation, no wheezes, rales or rhonchi, symmetric air entry Heart - normal rate, regular rhythm, normal S1, S2, no murmurs, rubs, clicks or gallops Musculoskeletal - right knee w/ some soft tissue swelling Extremities - peripheral pulses normal, no pedal edema, no clubbing or cyanosis  1.  Pre-operative clearance I have independently evaluated patient.  Tracie Noble is a 62 y.o. female who is low risk for an intermediate risk surgery.  There are not modifiable risk factors. Tracie Noble's RCRI calculation for MACE is: 0 (3.9% risk).  Form for Tracie Noble preop eval sent to the office.  Happy to provide a copy of today's exam should they request.  Tracie Noble, [facility]

## 2018-04-09 ENCOUNTER — Encounter: Payer: Self-pay | Admitting: Physical Therapy

## 2018-04-09 ENCOUNTER — Other Ambulatory Visit: Payer: Self-pay

## 2018-04-09 ENCOUNTER — Ambulatory Visit: Payer: Managed Care, Other (non HMO) | Attending: Orthopedic Surgery | Admitting: Physical Therapy

## 2018-04-09 DIAGNOSIS — M25661 Stiffness of right knee, not elsewhere classified: Secondary | ICD-10-CM

## 2018-04-09 DIAGNOSIS — R600 Localized edema: Secondary | ICD-10-CM | POA: Diagnosis present

## 2018-04-09 DIAGNOSIS — R262 Difficulty in walking, not elsewhere classified: Secondary | ICD-10-CM | POA: Insufficient documentation

## 2018-04-09 NOTE — Therapy (Signed)
[facility] [place], [place], [address] Phone: [phone]   Fax:  [phone]  Physical Therapy Evaluation  Patient Details  Name: Tracie Noble MRN: [id number] Date of Birth: [birth year] Referring Provider (PT): Edmonia Lynch, MD   Encounter Date: 04/09/2018  PT End of Session - 04/09/18 1459    Visit Number  1    Number of Visits  18    Date for PT Re-Evaluation  05/28/18    Authorization Type  Progress note every 10th visit    PT Start Time  1346    PT Stop Time  1430    PT Time Calculation (min)  44 min    Activity Tolerance  Patient tolerated treatment well    Behavior During Therapy  [facility] for tasks assessed/performed       Past Medical History:  Diagnosis Date  . Cancer ([place])    cervical at age 62     Past Surgical History:  Procedure Laterality Date  . ABDOMINAL HYSTERECTOMY    . JOINT REPLACEMENT     left- leg    There were no vitals filed for this visit.   Subjective Assessment - 04/09/18 1525    Subjective  Patient arrives to therapy with reports of right knee stiffness and difficulty walking secondary to a right partial medial knee replacement on 04/03/18. Patient reports using the CPM machine 4x per day for 2 hours per session. She has been ambulating within her home with her walker in between sessions. Patient reports she has been independent with basic ADLs and states her husband wraps her knee before she bathes. Patient reports "mild pain" when increasing the degree on the CPM machine but otherwise states she does not have any pain after taking her pain medication. Patient reports being compliant with ice and elevation. Patient would like to decrease pain, decrease movement, improve strength, improve ability to perform home and work activities.    Patient is accompained by:  Family member   Husband, Mikki Santee   Limitations  Walking;Standing;Sitting    Diagnostic tests  x-ray: medial compartment arthritis     Patient Stated Goals  decrease pain, walk normally    Currently in Pain?  No/denies         [facility] PT Assessment - 04/09/18 0001      Assessment   Medical Diagnosis  partial knee replacement    Referring Provider (PT)  Edmonia Lynch, MD    Onset Date/Surgical Date  04/03/18    Next MD Visit  04/16/18    Prior Therapy  no      Precautions   Precautions  None      Restrictions   Weight Bearing Restrictions  No      Balance Screen   Has the patient fallen in the past 6 months  No    Has the patient had a decrease in activity level because of a fear of falling?   No    Is the patient reluctant to leave their home because of a fear of falling?   No      Home Environment   Living Environment  Private residence    Living Arrangements  Spouse/significant other      Prior Function   Level of Independence  Independent with basic ADLs      Observation/Other Assessments   Observations  Aquacel dressing donned as well as compression stockings      Observation/Other Assessments-Edema    Edema  Circumferential  Circumferential Edema   Circumferential - Right  46 cm at mid patella    Circumferential - Left   42 cm at mid patella      ROM / Strength   AROM / PROM / Strength  AROM;PROM;Strength      AROM   AROM Assessment Site  Knee    Right/Left Knee  Right    Right Knee Extension  8    Right Knee Flexion  64      PROM   Overall PROM Comments  --    PROM Assessment Site  Knee    Right/Left Knee  Right    Right Knee Extension  6    Right Knee Flexion  76      Strength   Overall Strength Comments  grossly assessed 3/5, unable to perform straight leg raise without extension lag      Transfers   Transfers  Independent with all Transfers      Ambulation/Gait   Assistive device  Rolling walker    Gait Pattern  Step-through pattern;Decreased stride length;Decreased stance time - right;Decreased hip/knee flexion - right;Right flexed knee in stance;Decreased weight  shift to right                Objective measurements completed on examination: See above findings.              PT Education - 04/09/18 1527    Education Details  Quad sets, heel slides in supine and sitting    Person(s) Educated  Patient    Methods  Explanation;Demonstration;Handout    Comprehension  Verbalized understanding;Returned demonstration       PT Short Term Goals - 04/09/18 1529      PT SHORT TERM GOAL #1   Title  Patient will be independent with HEP    Time  3    Period  Weeks    Status  New      PT SHORT TERM GOAL #2   Title  Patient will demonstrate 5 degrees or less of right knee extension AROM to improve gait mechanics    Time  3    Period  Weeks    Status  New      PT SHORT TERM GOAL #3   Title  Patient will demonstrate 90+ degrees of right knee flexion AROM to improve ability to perform functional activities.    Time  3    Period  Weeks    Status  New        PT Long Term Goals - 04/09/18 1535      PT LONG TERM GOAL #1   Title  Patient will be independent with advanced HEP    Time  6    Period  Weeks    Status  New      PT LONG TERM GOAL #2   Title  Patient will demonstrate 0 degrees of right knee extension AROM to improve gait mechanics.    Time  6    Period  Weeks    Status  New      PT LONG TERM GOAL #3   Title  Patient will demonstrate 115+ degrees of right knee flexion AROM to improve ability to perform funtional activities.    Time  6    Period  Weeks    Status  New      PT LONG TERM GOAL #4   Title  Patient will demonstrate 4+/5 or greater right knee MMT to improve stability during  functional tasks.    Time  6    Period  Weeks    Status  New      PT LONG TERM GOAL #5   Title  Patient will report ability to ambulate community distances with no AD and less than 3/10 right knee pain to perform work activities.    Time  6    Period  Weeks    Status  New             Plan - 04/09/18 1528    Clinical  Impression Statement  Patient is a 62 year old female who presents to physical therapy with decreased R knee ROM and difficulty walking secondary to a partial right knee replacement on 04/03/18. Patient noted with increased right knee edema. Patient has aquacel dressing and compression stockings donned during evaluation. Patient ambulates with a rolling walker with step through gait pattern, decreased right weight bearing, decreased knee flexion during swing, increased knee flexion during stance and decreased stride length. Patient would benefit from skilled physical therapy to address deficits and address ongoing goals.     Clinical Presentation  Stable    Clinical Decision Making  Low    Rehab Potential  Excellent    PT Frequency  3x / week    PT Duration  6 weeks    PT Treatment/Interventions  ADLs/Self Care Home Management;Gait training;Stair training;Moist Heat;Cryotherapy;Lobbyist;Therapeutic exercise;Therapeutic activities;Functional mobility training;Neuromuscular re-education;Passive range of motion;Manual techniques;Patient/family education;Taping;Vasopneumatic Device    PT Next Visit Plan  nustep, knee AROM, PROM modalities PRN for pain relief    PT Home Exercise Plan  see patient education section    Consulted and Agree with Plan of Care  Patient       Patient will benefit from skilled therapeutic intervention in order to improve the following deficits and impairments:  Abnormal gait, Pain, Decreased activity tolerance, Decreased endurance, Decreased range of motion, Decreased strength, Increased edema, Difficulty walking  Visit Diagnosis: Stiffness of right knee, not elsewhere classified - Plan: PT plan of care cert/re-cert  Difficulty in walking, not elsewhere classified - Plan: PT plan of care cert/re-cert  Localized edema - Plan: PT plan of care cert/re-cert     Problem List Patient Active Problem List   Diagnosis Date Noted  . Primary  osteoarthritis of right knee 12/20/2017   Gabriela Eves, PT, DPT 04/09/2018, 3:45 PM  [facility] [place], [place], [address] Phone: [phone]   Fax:  [phone]  Name: RYLIEGH MCDUFFEY MRN: [id number] Date of Birth: [birth year]

## 2018-04-14 ENCOUNTER — Ambulatory Visit: Payer: Self-pay | Admitting: Physical Therapy

## 2018-04-14 ENCOUNTER — Ambulatory Visit: Payer: Managed Care, Other (non HMO) | Admitting: Physical Therapy

## 2018-04-14 ENCOUNTER — Encounter: Payer: Self-pay | Admitting: Physical Therapy

## 2018-04-14 DIAGNOSIS — M25661 Stiffness of right knee, not elsewhere classified: Secondary | ICD-10-CM

## 2018-04-14 DIAGNOSIS — R262 Difficulty in walking, not elsewhere classified: Secondary | ICD-10-CM

## 2018-04-14 DIAGNOSIS — R600 Localized edema: Secondary | ICD-10-CM

## 2018-04-14 NOTE — Therapy (Signed)
[facility] [place], [place], [address] Phone: [phone]   Fax:  [phone]  Physical Therapy Treatment  Patient Details  Name: Tracie Noble MRN: [id number] Date of Birth: 1956/01/14 Referring Provider (PT): Edmonia Lynch, MD   Encounter Date: 04/14/2018  PT End of Session - 04/14/18 1446    Visit Number  2    Number of Visits  18    Date for PT Re-Evaluation  05/28/18    Authorization Type  Progress note every 10th visit    PT Start Time  1430    PT Stop Time  1530    PT Time Calculation (min)  60 min    Activity Tolerance  Patient tolerated treatment well    Behavior During Therapy  [facility] for tasks assessed/performed       Past Medical History:  Diagnosis Date  . Cancer ([place])    cervical at age 54     Past Surgical History:  Procedure Laterality Date  . ABDOMINAL HYSTERECTOMY    . JOINT REPLACEMENT     left- leg    There were no vitals filed for this visit.  Subjective Assessment - 04/14/18 1445    Subjective  Patient reports being complaint with HEP and denies pain at the moment.    Patient is accompained by:  Family member    Limitations  Walking;Standing;Sitting    Diagnostic tests  x-ray: medial compartment arthritis    Patient Stated Goals  decrease pain, walk normally    Currently in Pain?  No/denies         [facility] PT Assessment - 04/14/18 0001      Assessment   Medical Diagnosis  partial knee replacement    Referring Provider (PT)  Edmonia Lynch, MD    Onset Date/Surgical Date  04/03/18    Next MD Visit  04/16/18    Prior Therapy  no      Precautions   Precautions  None      Restrictions   Weight Bearing Restrictions  No                   [facility] Adult PT Treatment/Exercise - 04/14/18 0001      Exercises   Exercises  Knee/Hip      Knee/Hip Exercises: Aerobic   Nustep  Level 1 x12 minutes increasing seat from 14 to 13      Knee/Hip Exercises: Supine   Quad Sets   Right;2 sets;10 reps   5" hold   Heel Slides  AAROM;Right;1 set;10 reps      Modalities   Modalities  Psychologist, educational Location  right knee    Electrical Stimulation Action  IFC    Electrical Stimulation Parameters  80-150 hz x15 min    Electrical Stimulation Goals  Pain      Vasopneumatic   Number Minutes Vasopneumatic   15 minutes    Vasopnuematic Location   Knee    Vasopneumatic Pressure  Low    Vasopneumatic Temperature   34      Manual Therapy   Manual Therapy  Passive ROM    Passive ROM  Gentle PROM into flexion and extension to improve ROM               PT Short Term Goals - 04/09/18 1529      PT SHORT TERM GOAL #1   Title  Patient will be independent with  HEP    Time  3    Period  Weeks    Status  New      PT SHORT TERM GOAL #2   Title  Patient will demonstrate 5 degrees or less of right knee extension AROM to improve gait mechanics    Time  3    Period  Weeks    Status  New      PT SHORT TERM GOAL #3   Title  Patient will demonstrate 90+ degrees of right knee flexion AROM to improve ability to perform functional activities.    Time  3    Period  Weeks    Status  New        PT Long Term Goals - 04/09/18 1535      PT LONG TERM GOAL #1   Title  Patient will be independent with advanced HEP    Time  6    Period  Weeks    Status  New      PT LONG TERM GOAL #2   Title  Patient will demonstrate 0 degrees of right knee extension AROM to improve gait mechanics.    Time  6    Period  Weeks    Status  New      PT LONG TERM GOAL #3   Title  Patient will demonstrate 115+ degrees of right knee flexion AROM to improve ability to perform funtional activities.    Time  6    Period  Weeks    Status  New      PT LONG TERM GOAL #4   Title  Patient will demonstrate 4+/5 or greater right knee MMT to improve stability during functional tasks.    Time  6    Period  Weeks    Status   New      PT LONG TERM GOAL #5   Title  Patient will report ability to ambulate community distances with no AD and less than 3/10 right knee pain to perform work activities.    Time  6    Period  Weeks    Status  New            Plan - 04/14/18 1458    Clinical Impression Statement  Patient was able to tolerate treatment well with no reports of pain just tiredness and weakness while performing exercises. Patient was cued to breathe through exercises rather than hold breath. Patient demonstrated improved breathing technique after cuing. Patient provided with handout for elevating LE  while icing. Normal response to modalities upon removal.     Clinical Presentation  Stable    Clinical Decision Making  Low    Rehab Potential  Excellent    PT Frequency  3x / week    PT Duration  6 weeks    PT Treatment/Interventions  ADLs/Self Care Home Management;Gait training;Stair training;Moist Heat;Cryotherapy;Lobbyist;Therapeutic exercise;Therapeutic activities;Functional mobility training;Neuromuscular re-education;Passive range of motion;Manual techniques;Patient/family education;Taping;Vasopneumatic Device    PT Next Visit Plan  nustep, knee AROM, PROM modalities PRN for pain relief    Consulted and Agree with Plan of Care  Patient       Patient will benefit from skilled therapeutic intervention in order to improve the following deficits and impairments:  Abnormal gait, Pain, Decreased activity tolerance, Decreased endurance, Decreased range of motion, Decreased strength, Increased edema, Difficulty walking  Visit Diagnosis: Stiffness of right knee, not elsewhere classified  Difficulty in walking, not elsewhere classified  Localized edema  Problem List Patient Active Problem List   Diagnosis Date Noted  . Primary osteoarthritis of right knee 12/20/2017   Gabriela Eves, PT, DPT 04/14/2018, 3:37 PM  [facility] [place], [place], [address] Phone: [phone]   Fax:  [phone]  Name: Tracie Noble MRN: [id number] Date of Birth: [birth year]

## 2018-04-15 ENCOUNTER — Ambulatory Visit: Payer: Managed Care, Other (non HMO) | Admitting: Physical Therapy

## 2018-04-15 ENCOUNTER — Encounter: Payer: Self-pay | Admitting: Physical Therapy

## 2018-04-15 DIAGNOSIS — R262 Difficulty in walking, not elsewhere classified: Secondary | ICD-10-CM

## 2018-04-15 DIAGNOSIS — R600 Localized edema: Secondary | ICD-10-CM

## 2018-04-15 DIAGNOSIS — M25661 Stiffness of right knee, not elsewhere classified: Secondary | ICD-10-CM | POA: Diagnosis not present

## 2018-04-15 NOTE — Therapy (Signed)
[facility] [place], [place], [address] Phone: [phone]   Fax:  [phone]  Physical Therapy Treatment  Patient Details  Name: Tracie Noble MRN: [id number] Date of Birth: 04/06/1956 Referring Provider (PT): Edmonia Lynch, MD   Encounter Date: 04/15/2018  PT End of Session - 04/15/18 1440    Visit Number  3    Number of Visits  18    Date for PT Re-Evaluation  05/28/18    Authorization Type  Progress note every 10th visit    PT Start Time  1432    PT Stop Time  1530    PT Time Calculation (min)  58 min    Activity Tolerance  Patient tolerated treatment well    Behavior During Therapy  [facility] for tasks assessed/performed       Past Medical History:  Diagnosis Date  . Cancer ([place])    cervical at age 62     Past Surgical History:  Procedure Laterality Date  . ABDOMINAL HYSTERECTOMY    . JOINT REPLACEMENT     left- leg    There were no vitals filed for this visit.  Subjective Assessment - 04/15/18 1429    Subjective  Reports nustep being a little harder today.    Limitations  Walking;Standing;Sitting    Diagnostic tests  x-ray: medial compartment arthritis    Patient Stated Goals  decrease pain, walk normally    Currently in Pain?  Yes    Pain Score  2     Pain Location  Knee    Pain Orientation  Right    Pain Descriptors / Indicators  Sore;Discomfort    Pain Type  Surgical pain    Pain Onset  1 to 4 weeks ago         [facility] PT Assessment - 04/15/18 0001      Assessment   Medical Diagnosis  partial knee replacement    Referring Provider (PT)  Edmonia Lynch, MD    Onset Date/Surgical Date  04/03/18    Next MD Visit  04/16/18    Prior Therapy  no      Precautions   Precautions  None      Restrictions   Weight Bearing Restrictions  No      Observation/Other Assessments-Edema    Edema  Circumferential      Circumferential Edema   Circumferential - Right  46.4 cm    Circumferential - Left    41.8 cm      ROM / Strength   AROM / PROM / Strength  AROM      AROM   Overall AROM   Deficits    AROM Assessment Site  Knee    Right/Left Knee  Right    Right Knee Extension  5    Right Knee Flexion  110                   [facility] Adult PT Treatment/Exercise - 04/15/18 0001      Exercises   Exercises  Knee/Hip      Knee/Hip Exercises: Aerobic   Nustep  L3, seat 12 -11 x15 min      Knee/Hip Exercises: Supine   Quad Sets  Strengthening;Right;2 sets;10 reps;Limitations    Quad Sets Limitations  5 sec holds    Heel Slides  AAROM;Right;10 reps    Heel Slides Limitations  5 sec holds at end range      Modalities   Modalities  Vasopneumatic  Vasopneumatic   Number Minutes Vasopneumatic   15 minutes    Vasopnuematic Location   Knee    Vasopneumatic Pressure  Low    Vasopneumatic Temperature   60      Manual Therapy   Manual Therapy  Passive ROM    Passive ROM  Gentle PROM into flexion and extension to improve ROM               PT Short Term Goals - 04/15/18 1535      PT SHORT TERM GOAL #1   Title  Patient will be independent with HEP    Time  3    Period  Weeks    Status  Achieved      PT SHORT TERM GOAL #2   Title  Patient will demonstrate 5 degrees or less of right knee extension AROM to improve gait mechanics    Time  3    Period  Weeks    Status  Achieved      PT SHORT TERM GOAL #3   Title  Patient will demonstrate 90+ degrees of right knee flexion AROM to improve ability to perform functional activities.    Time  3    Period  Weeks    Status  Achieved        PT Long Term Goals - 04/15/18 1535      PT LONG TERM GOAL #1   Title  Patient will be independent with advanced HEP    Time  6    Period  Weeks    Status  On-going      PT LONG TERM GOAL #2   Title  Patient will demonstrate 0 degrees of right knee extension AROM to improve gait mechanics.    Time  6    Period  Weeks    Status  On-going      PT LONG TERM GOAL #3    Title  Patient will demonstrate 115+ degrees of right knee flexion AROM to improve ability to perform funtional activities.    Time  6    Period  Weeks    Status  On-going      PT LONG TERM GOAL #4   Title  Patient will demonstrate 4+/5 or greater right knee MMT to improve stability during functional tasks.    Time  6    Period  Weeks    Status  On-going      PT LONG TERM GOAL #5   Title  Patient will report ability to ambulate community distances with no AD and less than 3/10 right knee pain to perform work activities.    Time  6    Period  Weeks    Status  On-going            Plan - 04/15/18 1525    Clinical Impression Statement  Patient tolerated today's treatment well with only minimal R knee pain. Patient reported compliance with HEP as well as CPM machine which she just achieved 100 deg on today. Patient also reports compliance with elevation and icing at this time. Patient requiring rest breaks with therex due to fatigue but overall slow and cautious pace with exercises. Prolonged holds instructed with heel slides and QS to emphasize quad activation as well as ROM. Aquacell still in place on R knee incision and B TED hose donned. Patient ambulating with FWW and limited R knee ROM. AROM of R knee measured as 5-110 deg in supine following therex and PROM session.  Normal vasopneumatic response noted following removal of the modality.    Rehab Potential  Excellent    PT Frequency  3x / week    PT Duration  6 weeks    PT Treatment/Interventions  ADLs/Self Care Home Management;Gait training;Stair training;Moist Heat;Cryotherapy;Lobbyist;Therapeutic exercise;Therapeutic activities;Functional mobility training;Neuromuscular re-education;Passive range of motion;Manual techniques;Patient/family education;Taping;Vasopneumatic Device    PT Next Visit Plan  nustep, knee AROM, PROM modalities PRN for pain relief    PT Home Exercise Plan  see patient education  section    Consulted and Agree with Plan of Care  Patient       Patient will benefit from skilled therapeutic intervention in order to improve the following deficits and impairments:  Abnormal gait, Pain, Decreased activity tolerance, Decreased endurance, Decreased range of motion, Decreased strength, Increased edema, Difficulty walking  Visit Diagnosis: Stiffness of right knee, not elsewhere classified  Difficulty in walking, not elsewhere classified  Localized edema     Problem List Patient Active Problem List   Diagnosis Date Noted  . Primary osteoarthritis of right knee 12/20/2017    Standley Brooking, PTA 04/15/2018, 3:36 PM  [facility] [place], [place], [address] Phone: [phone]   Fax:  [phone]  Name: Tracie Noble MRN: [id number] Date of Birth: November 16, 1955

## 2018-04-16 ENCOUNTER — Encounter: Payer: Managed Care, Other (non HMO) | Admitting: Physical Therapy

## 2018-04-17 ENCOUNTER — Ambulatory Visit: Payer: Managed Care, Other (non HMO) | Admitting: Physical Therapy

## 2018-04-17 ENCOUNTER — Encounter: Payer: Self-pay | Admitting: Physical Therapy

## 2018-04-17 DIAGNOSIS — R262 Difficulty in walking, not elsewhere classified: Secondary | ICD-10-CM

## 2018-04-17 DIAGNOSIS — M25661 Stiffness of right knee, not elsewhere classified: Secondary | ICD-10-CM | POA: Diagnosis not present

## 2018-04-17 DIAGNOSIS — R600 Localized edema: Secondary | ICD-10-CM

## 2018-04-17 NOTE — Therapy (Signed)
[facility] [place], [place], [address] Phone: [phone]   Fax:  [phone]  Physical Therapy Treatment  Patient Details  Name: Tracie Noble MRN: [id number] Date of Birth: April 28, 1956 Referring Provider (PT): Edmonia Lynch, MD   Encounter Date: 04/17/2018  PT End of Session - 04/17/18 1446    Visit Number  4    Number of Visits  18    Date for PT Re-Evaluation  05/28/18    Authorization Type  Progress note every 10th visit    PT Start Time  1430    PT Stop Time  1530    PT Time Calculation (min)  60 min    Activity Tolerance  Patient tolerated treatment well    Behavior During Therapy  [facility] for tasks assessed/performed       Past Medical History:  Diagnosis Date  . Cancer ([place])    cervical at age 71     Past Surgical History:  Procedure Laterality Date  . ABDOMINAL HYSTERECTOMY    . JOINT REPLACEMENT     left- leg    There were no vitals filed for this visit.  Subjective Assessment - 04/17/18 1436    Subjective  Patient reported MD follow up went well. She reports no pain at the moment.    Patient is accompained by:  Family member    Limitations  Walking;Standing;Sitting    Diagnostic tests  x-ray: medial compartment arthritis    Patient Stated Goals  decrease pain, walk normally    Currently in Pain?  No/denies         [facility] PT Assessment - 04/17/18 0001      Assessment   Medical Diagnosis  partial knee replacement    Referring Provider (PT)  Edmonia Lynch, MD    Onset Date/Surgical Date  04/03/18    Next MD Visit  04/16/18    Prior Therapy  no      Precautions   Precautions  None      Restrictions   Weight Bearing Restrictions  No                   [facility] Adult PT Treatment/Exercise - 04/17/18 0001      Exercises   Exercises  Knee/Hip      Knee/Hip Exercises: Stretches   Passive Hamstring Stretch  3 reps;30 seconds    Knee: Self-Stretch to increase Flexion  3 reps;30  seconds      Knee/Hip Exercises: Aerobic   Nustep  L3, seat 12 -11 x15 min      Knee/Hip Exercises: Standing   Hip Flexion  Right;2 sets;10 reps;Knee bent      Modalities   Modalities  Vasopneumatic      Vasopneumatic   Number Minutes Vasopneumatic   15 minutes    Vasopnuematic Location   Knee    Vasopneumatic Pressure  Low      Manual Therapy   Manual Therapy  Passive ROM    Passive ROM  Gentle PROM into flexion and extension to improve ROM               PT Short Term Goals - 04/15/18 1535      PT SHORT TERM GOAL #1   Title  Patient will be independent with HEP    Time  3    Period  Weeks    Status  Achieved      PT SHORT TERM GOAL #2   Title  Patient will  demonstrate 5 degrees or less of right knee extension AROM to improve gait mechanics    Time  3    Period  Weeks    Status  Achieved      PT SHORT TERM GOAL #3   Title  Patient will demonstrate 90+ degrees of right knee flexion AROM to improve ability to perform functional activities.    Time  3    Period  Weeks    Status  Achieved        PT Long Term Goals - 04/15/18 1535      PT LONG TERM GOAL #1   Title  Patient will be independent with advanced HEP    Time  6    Period  Weeks    Status  On-going      PT LONG TERM GOAL #2   Title  Patient will demonstrate 0 degrees of right knee extension AROM to improve gait mechanics.    Time  6    Period  Weeks    Status  On-going      PT LONG TERM GOAL #3   Title  Patient will demonstrate 115+ degrees of right knee flexion AROM to improve ability to perform funtional activities.    Time  6    Period  Weeks    Status  On-going      PT LONG TERM GOAL #4   Title  Patient will demonstrate 4+/5 or greater right knee MMT to improve stability during functional tasks.    Time  6    Period  Weeks    Status  On-going      PT LONG TERM GOAL #5   Title  Patient will report ability to ambulate community distances with no AD and less than 3/10 right knee  pain to perform work activities.    Time  6    Period  Weeks    Status  On-going            Plan - 04/17/18 1609    Clinical Impression Statement  Patient was able to tolerate treatment well. She arrived ambulating without an AD with decreased weight shift on right, decreased stance time, and decreased knee flexion during swing. Patient provided with HEP and instructed to continue to ice and elevate for edema control and pain relief. Patient and husband reported understanding. Normal response to vasopneumatic device upon removal.    Clinical Presentation  Stable    Clinical Decision Making  Low    Rehab Potential  Excellent    PT Frequency  3x / week    PT Duration  6 weeks    PT Treatment/Interventions  ADLs/Self Care Home Management;Gait training;Stair training;Moist Heat;Cryotherapy;Lobbyist;Therapeutic exercise;Therapeutic activities;Functional mobility training;Neuromuscular re-education;Passive range of motion;Manual techniques;Patient/family education;Taping;Vasopneumatic Device    PT Next Visit Plan  nustep, knee AROM, PROM modalities PRN for pain relief    Consulted and Agree with Plan of Care  Patient       Patient will benefit from skilled therapeutic intervention in order to improve the following deficits and impairments:  Abnormal gait, Pain, Decreased activity tolerance, Decreased endurance, Decreased range of motion, Decreased strength, Increased edema, Difficulty walking  Visit Diagnosis: Stiffness of right knee, not elsewhere classified  Difficulty in walking, not elsewhere classified  Localized edema     Problem List Patient Active Problem List   Diagnosis Date Noted  . Primary osteoarthritis of right knee 12/20/2017   Gabriela Eves, PT, DPT 04/17/2018, 6:06 PM  [facility] [place], [place], [address] Phone: [phone]   Fax:  [phone]  Name: Tracie Noble MRN: [id number] Date of Birth: 1955-10-21

## 2018-04-21 ENCOUNTER — Encounter: Payer: Self-pay | Admitting: Physical Therapy

## 2018-04-21 ENCOUNTER — Ambulatory Visit: Payer: Managed Care, Other (non HMO) | Admitting: Physical Therapy

## 2018-04-21 DIAGNOSIS — R600 Localized edema: Secondary | ICD-10-CM

## 2018-04-21 DIAGNOSIS — R262 Difficulty in walking, not elsewhere classified: Secondary | ICD-10-CM

## 2018-04-21 DIAGNOSIS — M25661 Stiffness of right knee, not elsewhere classified: Secondary | ICD-10-CM | POA: Diagnosis not present

## 2018-04-21 NOTE — Therapy (Signed)
[facility] [place], [place], [address] Phone: [phone]   Fax:  [phone]  Physical Therapy Treatment  Patient Details  Name: Tracie Noble MRN: [id number] Date of Birth: [birth year] Referring Provider (PT): Edmonia Lynch, MD   Encounter Date: 04/21/2018  PT End of Session - 04/21/18 1437    Visit Number  5    Number of Visits  18    Date for PT Re-Evaluation  05/28/18    Authorization Type  Progress note every 10th visit    PT Start Time  1428    PT Stop Time  1525    PT Time Calculation (min)  57 min    Activity Tolerance  Patient tolerated treatment well    Behavior During Therapy  [facility] for tasks assessed/performed       Past Medical History:  Diagnosis Date  . Cancer ([place])    cervical at age 88     Past Surgical History:  Procedure Laterality Date  . ABDOMINAL HYSTERECTOMY    . JOINT REPLACEMENT     left- leg    There were no vitals filed for this visit.  Subjective Assessment - 04/21/18 1435    Subjective  Reports she is still having the stiffness as she is not using the CPM as the MD said she didn't have to. Reports that trying to walk around every 30 minutes and using her walking as to normalize her gait pattern.     Limitations  Walking;Standing;Sitting    Diagnostic tests  x-ray: medial compartment arthritis    Patient Stated Goals  decrease pain, walk normally    Currently in Pain?  Yes    Pain Score  --   Unable to determine pain score   Pain Location  Knee    Pain Orientation  Right    Pain Descriptors / Indicators  Other (Comment)   stiffness   Pain Type  Surgical pain    Pain Onset  1 to 4 weeks ago         [facility] PT Assessment - 04/21/18 0001      Assessment   Medical Diagnosis  partial knee replacement    Referring Provider (PT)  Edmonia Lynch, MD    Onset Date/Surgical Date  04/03/18    Next MD Visit  05/14/2018    Prior Therapy  no      Precautions   Precautions  None       Restrictions   Weight Bearing Restrictions  No      ROM / Strength   AROM / PROM / Strength  AROM      AROM   Overall AROM   Deficits    AROM Assessment Site  Knee    Right/Left Knee  Right    Right Knee Extension  3    Right Knee Flexion  96                   [facility] Adult PT Treatment/Exercise - 04/21/18 0001      Exercises   Exercises  Knee/Hip      Knee/Hip Exercises: Aerobic   Nustep  L4 , seat 13-12 x15 min      Knee/Hip Exercises: Standing   Hip Flexion  AROM;Right;10 reps;Knee bent    Forward Lunges  Right;10 reps;5 seconds    Hip Abduction  AROM;Right;10 reps;Knee straight      Knee/Hip Exercises: Supine   Heel Slides  AROM;Right;5 reps    Heel Slides  Limitations  5 sec holds at end range    Other Supine Knee/Hip Exercises  R foot slides on wall x10 reps      Modalities   Modalities  Electrical Stimulation;Vasopneumatic      Electrical Stimulation   Electrical Stimulation Location  R knee    Electrical Stimulation Action  IFC    Electrical Stimulation Parameters  80-150 hz x15 min    Electrical Stimulation Goals  Pain;Edema      Vasopneumatic   Number Minutes Vasopneumatic   15 minutes    Vasopnuematic Location   Knee    Vasopneumatic Pressure  Low    Vasopneumatic Temperature   34               PT Short Term Goals - 04/15/18 1535      PT SHORT TERM GOAL #1   Title  Patient will be independent with HEP    Time  3    Period  Weeks    Status  Achieved      PT SHORT TERM GOAL #2   Title  Patient will demonstrate 5 degrees or less of right knee extension AROM to improve gait mechanics    Time  3    Period  Weeks    Status  Achieved      PT SHORT TERM GOAL #3   Title  Patient will demonstrate 90+ degrees of right knee flexion AROM to improve ability to perform functional activities.    Time  3    Period  Weeks    Status  Achieved        PT Long Term Goals - 04/15/18 1535      PT LONG TERM GOAL #1   Title  Patient  will be independent with advanced HEP    Time  6    Period  Weeks    Status  On-going      PT LONG TERM GOAL #2   Title  Patient will demonstrate 0 degrees of right knee extension AROM to improve gait mechanics.    Time  6    Period  Weeks    Status  On-going      PT LONG TERM GOAL #3   Title  Patient will demonstrate 115+ degrees of right knee flexion AROM to improve ability to perform funtional activities.    Time  6    Period  Weeks    Status  On-going      PT LONG TERM GOAL #4   Title  Patient will demonstrate 4+/5 or greater right knee MMT to improve stability during functional tasks.    Time  6    Period  Weeks    Status  On-going      PT LONG TERM GOAL #5   Title  Patient will report ability to ambulate community distances with no AD and less than 3/10 right knee pain to perform work activities.    Time  6    Period  Weeks    Status  On-going            Plan - 04/21/18 1518    Clinical Impression Statement  Patient tolerated today's treatment fairly well with greater complaints of R knee stiffness. Patient highly encouraged to complete HEP 2-3x per day and to walk as she felt able to reduce pain and stiffness. Patient more limited with knee flexion today even following flexion focused exercises. Patient still very guarded during therex and very slow and cautious  with movement. AROM of R knee measured as 3-96 deg. Normal modalities response noted following removal of the modalities. Patient reported soreness following end of treatment.    Rehab Potential  Excellent    PT Frequency  3x / week    PT Duration  6 weeks    PT Treatment/Interventions  ADLs/Self Care Home Management;Gait training;Stair training;Moist Heat;Cryotherapy;Lobbyist;Therapeutic exercise;Therapeutic activities;Functional mobility training;Neuromuscular re-education;Passive range of motion;Manual techniques;Patient/family education;Taping;Vasopneumatic Device    PT Next  Visit Plan  nustep, knee AROM, PROM modalities PRN for pain relief    PT Home Exercise Plan  see patient education section    Consulted and Agree with Plan of Care  Patient       Patient will benefit from skilled therapeutic intervention in order to improve the following deficits and impairments:  Abnormal gait, Pain, Decreased activity tolerance, Decreased endurance, Decreased range of motion, Decreased strength, Increased edema, Difficulty walking  Visit Diagnosis: Stiffness of right knee, not elsewhere classified  Difficulty in walking, not elsewhere classified  Localized edema     Problem List Patient Active Problem List   Diagnosis Date Noted  . Primary osteoarthritis of right knee 12/20/2017    Standley Brooking, PTA 04/21/2018, 3:32 PM  [facility] [place], [place], [address] Phone: [phone]   Fax:  [phone]  Name: Tracie Noble MRN: [id number] Date of Birth: January 26, 1956

## 2018-04-23 ENCOUNTER — Ambulatory Visit: Payer: Managed Care, Other (non HMO) | Admitting: Physical Therapy

## 2018-04-23 ENCOUNTER — Encounter: Payer: Self-pay | Admitting: Physical Therapy

## 2018-04-23 DIAGNOSIS — R600 Localized edema: Secondary | ICD-10-CM

## 2018-04-23 DIAGNOSIS — R262 Difficulty in walking, not elsewhere classified: Secondary | ICD-10-CM

## 2018-04-23 DIAGNOSIS — M25661 Stiffness of right knee, not elsewhere classified: Secondary | ICD-10-CM | POA: Diagnosis not present

## 2018-04-23 NOTE — Therapy (Signed)
[facility] [place], [place], [address] Phone: [phone]   Fax:  [phone]  Physical Therapy Treatment  Patient Details  Name: Tracie Noble MRN: [id number] Date of Birth: [birth year] Referring Provider (PT): Edmonia Lynch, MD   Encounter Date: 04/23/2018  PT End of Session - 04/23/18 1429    Visit Number  6    Number of Visits  18    Date for PT Re-Evaluation  05/28/18    Authorization Type  Progress note every 10th visit    PT Start Time  1428    PT Stop Time  1524    PT Time Calculation (min)  56 min    Activity Tolerance  Patient tolerated treatment well    Behavior During Therapy  [facility] for tasks assessed/performed       Past Medical History:  Diagnosis Date  . Cancer ([place])    cervical at age 41     Past Surgical History:  Procedure Laterality Date  . ABDOMINAL HYSTERECTOMY    . JOINT REPLACEMENT     left- leg    There were no vitals filed for this visit.  Subjective Assessment - 04/23/18 1428    Subjective  Reports doing a lot of walking today.    Limitations  Walking;Standing;Sitting    Diagnostic tests  x-ray: medial compartment arthritis    Patient Stated Goals  decrease pain, walk normally    Currently in Pain?  Yes    Pain Score  --   No score provided   Pain Location  Knee    Pain Orientation  Right;Proximal    Pain Descriptors / Indicators  Sore;Discomfort    Pain Type  Surgical pain    Pain Onset  1 to 4 weeks ago         [facility] PT Assessment - 04/23/18 0001      Assessment   Medical Diagnosis  partial knee replacement    Referring Provider (PT)  Edmonia Lynch, MD    Onset Date/Surgical Date  04/03/18    Next MD Visit  05/14/2018    Prior Therapy  no      Precautions   Precautions  None      Restrictions   Weight Bearing Restrictions  No      ROM / Strength   AROM / PROM / Strength  AROM      AROM   Overall AROM   Deficits    AROM Assessment Site  Knee    Right/Left Knee  Right    Right Knee Extension  3    Right Knee Flexion  99                   [facility] Adult PT Treatment/Exercise - 04/23/18 0001      Exercises   Exercises  Knee/Hip      Knee/Hip Exercises: Stretches   Passive Hamstring Stretch  3 reps;30 seconds      Knee/Hip Exercises: Aerobic   Nustep  L4, seat 11 x17 min      Knee/Hip Exercises: Standing   Hip Flexion  AROM;Right;10 reps;Knee bent    Forward Lunges  Right;10 reps;5 seconds      Knee/Hip Exercises: Supine   Other Supine Knee/Hip Exercises  R foot slides on wall x10 reps      Modalities   Modalities  Vasopneumatic      Electrical Stimulation   Electrical Stimulation Location  --    Printmaker Action  --  Electrical Stimulation Parameters  --    Printmaker Goals  --      Vasopneumatic   Number Minutes Vasopneumatic   15 minutes    Vasopnuematic Location   Knee    Vasopneumatic Pressure  Low    Vasopneumatic Temperature   34      Manual Therapy   Manual Therapy  Passive ROM    Passive ROM  Gentle PROM into flexion to improve ROM               PT Short Term Goals - 04/15/18 1535      PT SHORT TERM GOAL #1   Title  Patient will be independent with HEP    Time  3    Period  Weeks    Status  Achieved      PT SHORT TERM GOAL #2   Title  Patient will demonstrate 5 degrees or less of right knee extension AROM to improve gait mechanics    Time  3    Period  Weeks    Status  Achieved      PT SHORT TERM GOAL #3   Title  Patient will demonstrate 90+ degrees of right knee flexion AROM to improve ability to perform functional activities.    Time  3    Period  Weeks    Status  Achieved        PT Long Term Goals - 04/15/18 1535      PT LONG TERM GOAL #1   Title  Patient will be independent with advanced HEP    Time  6    Period  Weeks    Status  On-going      PT LONG TERM GOAL #2   Title  Patient will demonstrate 0 degrees of right knee  extension AROM to improve gait mechanics.    Time  6    Period  Weeks    Status  On-going      PT LONG TERM GOAL #3   Title  Patient will demonstrate 115+ degrees of right knee flexion AROM to improve ability to perform funtional activities.    Time  6    Period  Weeks    Status  On-going      PT LONG TERM GOAL #4   Title  Patient will demonstrate 4+/5 or greater right knee MMT to improve stability during functional tasks.    Time  6    Period  Weeks    Status  On-going      PT LONG TERM GOAL #5   Title  Patient will report ability to ambulate community distances with no AD and less than 3/10 right knee pain to perform work activities.    Time  6    Period  Weeks    Status  On-going            Plan - 04/23/18 1523    Clinical Impression Statement  Patient tolerated today's treatment fairly well although very sore and uncomfortable from increased walking and exercises at home. Patient reported longer holds with exercises. Patient educated regarding the balance of rest and HEP. Patient encouraged to complete HEP again at home prior to bed but to not be overzealous with hold times and reps. Patient still very guarded today with exercise due to discomfort and tightness. AROM of R knee measured as 3-99 deg. Normal vasopnuematic response noted following removal of modality. No electrical stimulation per patient preference.    Rehab Potential  Excellent    PT Frequency  3x / week    PT Duration  6 weeks    PT Treatment/Interventions  ADLs/Self Care Home Management;Gait training;Stair training;Moist Heat;Cryotherapy;Lobbyist;Therapeutic exercise;Therapeutic activities;Functional mobility training;Neuromuscular re-education;Passive range of motion;Manual techniques;Patient/family education;Taping;Vasopneumatic Device    PT Next Visit Plan  nustep, knee AROM, PROM modalities PRN for pain relief    PT Home Exercise Plan  see patient education section     Consulted and Agree with Plan of Care  Patient       Patient will benefit from skilled therapeutic intervention in order to improve the following deficits and impairments:  Abnormal gait, Pain, Decreased activity tolerance, Decreased endurance, Decreased range of motion, Decreased strength, Increased edema, Difficulty walking  Visit Diagnosis: Stiffness of right knee, not elsewhere classified  Difficulty in walking, not elsewhere classified  Localized edema     Problem List Patient Active Problem List   Diagnosis Date Noted  . Primary osteoarthritis of right knee 12/20/2017    Standley Brooking, PTA 04/23/2018, 3:44 PM  [facility] [place], [place], [address] Phone: [phone]   Fax:  [phone]  Name: Tracie Noble MRN: [id number] Date of Birth: 01-25-1956

## 2018-04-24 ENCOUNTER — Encounter: Payer: Self-pay | Admitting: Physical Therapy

## 2018-04-24 ENCOUNTER — Ambulatory Visit: Payer: Managed Care, Other (non HMO) | Admitting: Physical Therapy

## 2018-04-24 DIAGNOSIS — M25661 Stiffness of right knee, not elsewhere classified: Secondary | ICD-10-CM

## 2018-04-24 DIAGNOSIS — R262 Difficulty in walking, not elsewhere classified: Secondary | ICD-10-CM

## 2018-04-24 DIAGNOSIS — R600 Localized edema: Secondary | ICD-10-CM

## 2018-04-24 NOTE — Therapy (Signed)
[facility] [place], [place], [address] Phone: [phone]   Fax:  [phone]  Physical Therapy Treatment  Patient Details  Name: Tracie Noble MRN: [id number] Date of Birth: 06-30-1955 Referring Provider (PT): Edmonia Lynch, MD   Encounter Date: 04/24/2018  PT End of Session - 04/24/18 1437    Visit Number  7    Number of Visits  18    Date for PT Re-Evaluation  05/28/18    Authorization Type  Progress note every 10th visit    PT Start Time  1427    PT Stop Time  1526    PT Time Calculation (min)  59 min    Activity Tolerance  Patient tolerated treatment well    Behavior During Therapy  [facility] for tasks assessed/performed       Past Medical History:  Diagnosis Date  . Cancer ([place])    cervical at age 54     Past Surgical History:  Procedure Laterality Date  . ABDOMINAL HYSTERECTOMY    . JOINT REPLACEMENT     left- leg    There were no vitals filed for this visit.  Subjective Assessment - 04/24/18 1434    Subjective  Patient reports feeling stiffness along sides of the knee and a little in the front but no pain.    Limitations  Walking;Standing;Sitting    Diagnostic tests  x-ray: medial compartment arthritis    Patient Stated Goals  decrease pain, walk normally    Currently in Pain?  No/denies         [facility] PT Assessment - 04/24/18 0001      Assessment   Medical Diagnosis  partial knee replacement    Referring Provider (PT)  Edmonia Lynch, MD    Onset Date/Surgical Date  04/03/18    Next MD Visit  05/14/2018    Prior Therapy  no      Precautions   Precautions  None      Restrictions   Weight Bearing Restrictions  No                   [facility] Adult PT Treatment/Exercise - 04/24/18 0001      Exercises   Exercises  Knee/Hip      Knee/Hip Exercises: Aerobic   Nustep  L4, seat 11 x15 min      Knee/Hip Exercises: Supine   Heel Prop for Knee Extension  5 minutes   with STW/M   Straight Leg Raises  AROM;Strengthening;Right;2 sets;10 reps      Knee/Hip Exercises: Prone   Hamstring Curl  1 set;5 reps      Vasopneumatic   Number Minutes Vasopneumatic   15 minutes    Vasopnuematic Location   Knee    Vasopneumatic Pressure  Low    Vasopneumatic Temperature   34      Manual Therapy   Manual Therapy  Passive ROM;Soft tissue mobilization    Soft tissue mobilization  STW/M to posterior and medial knee to decrease pain and tension with heel propped    Passive ROM  PROM into flexion with 5" hold at end range to improve ROM               PT Short Term Goals - 04/15/18 1535      PT SHORT TERM GOAL #1   Title  Patient will be independent with HEP    Time  3    Period  Weeks    Status  Achieved  PT SHORT TERM GOAL #2   Title  Patient will demonstrate 5 degrees or less of right knee extension AROM to improve gait mechanics    Time  3    Period  Weeks    Status  Achieved      PT SHORT TERM GOAL #3   Title  Patient will demonstrate 90+ degrees of right knee flexion AROM to improve ability to perform functional activities.    Time  3    Period  Weeks    Status  Achieved        PT Long Term Goals - 04/15/18 1535      PT LONG TERM GOAL #1   Title  Patient will be independent with advanced HEP    Time  6    Period  Weeks    Status  On-going      PT LONG TERM GOAL #2   Title  Patient will demonstrate 0 degrees of right knee extension AROM to improve gait mechanics.    Time  6    Period  Weeks    Status  On-going      PT LONG TERM GOAL #3   Title  Patient will demonstrate 115+ degrees of right knee flexion AROM to improve ability to perform funtional activities.    Time  6    Period  Weeks    Status  On-going      PT LONG TERM GOAL #4   Title  Patient will demonstrate 4+/5 or greater right knee MMT to improve stability during functional tasks.    Time  6    Period  Weeks    Status  On-going      PT LONG TERM GOAL #5   Title  Patient  will report ability to ambulate community distances with no AD and less than 3/10 right knee pain to perform work activities.    Time  6    Period  Weeks    Status  On-going            Plan - 04/24/18 1512    Clinical Impression Statement  Patient was able to tolerate treatment well with the addition of new exercises. Patient noted with intermittent guarding with PROM which reduced with oscillations. Patient and PT discussed finding balance of exercises and rest to allow for healing. Patient instructed to continue exercises provided especially on days when she is not in therapy. Patient reported understanding. Normal response to modalities upon removal.    Clinical Presentation  Stable    Clinical Decision Making  Low    Rehab Potential  Excellent    PT Frequency  3x / week    PT Duration  6 weeks    PT Treatment/Interventions  ADLs/Self Care Home Management;Gait training;Stair training;Moist Heat;Cryotherapy;Lobbyist;Therapeutic exercise;Therapeutic activities;Functional mobility training;Neuromuscular re-education;Passive range of motion;Manual techniques;Patient/family education;Taping;Vasopneumatic Device    PT Next Visit Plan  nustep, knee AROM, PROM modalities PRN for pain relief    Consulted and Agree with Plan of Care  Patient       Patient will benefit from skilled therapeutic intervention in order to improve the following deficits and impairments:  Abnormal gait, Pain, Decreased activity tolerance, Decreased endurance, Decreased range of motion, Decreased strength, Increased edema, Difficulty walking  Visit Diagnosis: Stiffness of right knee, not elsewhere classified  Difficulty in walking, not elsewhere classified  Localized edema     Problem List Patient Active Problem List   Diagnosis Date Noted  . Primary osteoarthritis  of right knee 12/20/2017   Gabriela Eves, PT, DPT 04/24/2018, 4:28 PM  [facility] [place], [place], [address] Phone: [phone]   Fax:  [phone]  Name: Tracie Noble MRN: [id number] Date of Birth: 1956/01/08

## 2018-04-28 ENCOUNTER — Ambulatory Visit: Payer: Managed Care, Other (non HMO) | Admitting: Physical Therapy

## 2018-04-28 ENCOUNTER — Encounter: Payer: Self-pay | Admitting: Physical Therapy

## 2018-04-28 DIAGNOSIS — M25661 Stiffness of right knee, not elsewhere classified: Secondary | ICD-10-CM | POA: Diagnosis not present

## 2018-04-28 DIAGNOSIS — R600 Localized edema: Secondary | ICD-10-CM

## 2018-04-28 DIAGNOSIS — R262 Difficulty in walking, not elsewhere classified: Secondary | ICD-10-CM

## 2018-04-28 NOTE — Therapy (Signed)
[facility] [place], [place], [address] Phone: [phone]   Fax:  [phone]  Physical Therapy Treatment  Patient Details  Name: Tracie Noble MRN: [id number] Date of Birth: March 24, 1956 Referring Provider (PT): Edmonia Lynch, MD   Encounter Date: 04/28/2018  PT End of Session - 04/28/18 1439    Visit Number  8    Number of Visits  18    Date for PT Re-Evaluation  05/28/18    Authorization Type  Progress note every 10th visit    PT Start Time  1430    PT Stop Time  1526    PT Time Calculation (min)  56 min    Activity Tolerance  Patient tolerated treatment well    Behavior During Therapy  [facility] for tasks assessed/performed       Past Medical History:  Diagnosis Date  . Cancer ([place])    cervical at age 80     Past Surgical History:  Procedure Laterality Date  . ABDOMINAL HYSTERECTOMY    . JOINT REPLACEMENT     left- leg    There were no vitals filed for this visit.  Subjective Assessment - 04/28/18 1438    Subjective  Patient reports feeling sore after last visit but reported it helped her bend her knee a little more over the weekend.     Limitations  Walking;Standing;Sitting    Diagnostic tests  x-ray: medial compartment arthritis    Patient Stated Goals  decrease pain, walk normally    Currently in Pain?  No/denies         [facility] PT Assessment - 04/28/18 0001      Assessment   Medical Diagnosis  partial knee replacement    Referring Provider (PT)  Edmonia Lynch, MD    Onset Date/Surgical Date  04/03/18    Next MD Visit  05/14/2018    Prior Therapy  no      Precautions   Precautions  None      Restrictions   Weight Bearing Restrictions  No                   [facility] Adult PT Treatment/Exercise - 04/28/18 0001      Exercises   Exercises  Knee/Hip      Knee/Hip Exercises: Aerobic   Nustep  Level 3 x15 moving from seat 12 to 10      Knee/Hip Exercises: Standing   Hip Flexion  --     Rocker Board  3 minutes      Knee/Hip Exercises: Seated   Long Arc Quad  AROM;Right;10 reps    Hamstring Curl  Strengthening;Right;10 reps    Hamstring Limitations  red       Knee/Hip Exercises: Supine   Other Supine Knee/Hip Exercises  R foot slides on wall x10 reps      Vasopneumatic   Number Minutes Vasopneumatic   15 minutes    Vasopnuematic Location   Knee    Vasopneumatic Pressure  Low    Vasopneumatic Temperature   34      Manual Therapy   Manual Therapy  Passive ROM;Soft tissue mobilization    Soft tissue mobilization  --    Passive ROM  PROM into flexion with 5" hold at end range to improve ROM               PT Short Term Goals - 04/15/18 1535      PT SHORT TERM GOAL #1   Title  Patient will be independent with HEP    Time  3    Period  Weeks    Status  Achieved      PT SHORT TERM GOAL #2   Title  Patient will demonstrate 5 degrees or less of right knee extension AROM to improve gait mechanics    Time  3    Period  Weeks    Status  Achieved      PT SHORT TERM GOAL #3   Title  Patient will demonstrate 90+ degrees of right knee flexion AROM to improve ability to perform functional activities.    Time  3    Period  Weeks    Status  Achieved        PT Long Term Goals - 04/15/18 1535      PT LONG TERM GOAL #1   Title  Patient will be independent with advanced HEP    Time  6    Period  Weeks    Status  On-going      PT LONG TERM GOAL #2   Title  Patient will demonstrate 0 degrees of right knee extension AROM to improve gait mechanics.    Time  6    Period  Weeks    Status  On-going      PT LONG TERM GOAL #3   Title  Patient will demonstrate 115+ degrees of right knee flexion AROM to improve ability to perform funtional activities.    Time  6    Period  Weeks    Status  On-going      PT LONG TERM GOAL #4   Title  Patient will demonstrate 4+/5 or greater right knee MMT to improve stability during functional tasks.    Time  6    Period   Weeks    Status  On-going      PT LONG TERM GOAL #5   Title  Patient will report ability to ambulate community distances with no AD and less than 3/10 right knee pain to perform work activities.    Time  6    Period  Weeks    Status  On-going            Plan - 04/28/18 1516    Clinical Impression Statement  Patient was able to tolerate progression of treatment well despite pain in medial aspect of the right knee. Patient noted with less guarding during PROM but is still limited by pain toward end ranges. Patient instructed to continue HEP and try to increase walking time per patient tolerance. Patient reported understanding. Normal response to modalities at end of session.     Clinical Presentation  Stable    Clinical Decision Making  Low    Rehab Potential  Excellent    PT Frequency  3x / week    PT Duration  6 weeks    PT Treatment/Interventions  ADLs/Self Care Home Management;Gait training;Stair training;Moist Heat;Cryotherapy;Lobbyist;Therapeutic exercise;Therapeutic activities;Functional mobility training;Neuromuscular re-education;Passive range of motion;Manual techniques;Patient/family education;Taping;Vasopneumatic Device    PT Next Visit Plan  nustep, knee AROM, PROM modalities PRN for pain relief    Consulted and Agree with Plan of Care  Patient       Patient will benefit from skilled therapeutic intervention in order to improve the following deficits and impairments:  Abnormal gait, Pain, Decreased activity tolerance, Decreased endurance, Decreased range of motion, Decreased strength, Increased edema, Difficulty walking  Visit Diagnosis: Stiffness of right knee, not elsewhere classified  Difficulty  in walking, not elsewhere classified  Localized edema     Problem List Patient Active Problem List   Diagnosis Date Noted  . Primary osteoarthritis of right knee 12/20/2017    Gabriela Eves, PT, DPT 04/28/2018, 3:58 PM  [facility] [place], [place], [address] Phone: [phone]   Fax:  [phone]  Name: Tracie Noble MRN: [id number] Date of Birth: [birth year]

## 2018-04-30 ENCOUNTER — Ambulatory Visit: Payer: Managed Care, Other (non HMO) | Admitting: Physical Therapy

## 2018-04-30 ENCOUNTER — Encounter: Payer: Self-pay | Admitting: Physical Therapy

## 2018-04-30 DIAGNOSIS — R600 Localized edema: Secondary | ICD-10-CM

## 2018-04-30 DIAGNOSIS — M25661 Stiffness of right knee, not elsewhere classified: Secondary | ICD-10-CM

## 2018-04-30 DIAGNOSIS — R262 Difficulty in walking, not elsewhere classified: Secondary | ICD-10-CM

## 2018-04-30 NOTE — Therapy (Signed)
[facility] [place], [place], [address] Phone: [phone]   Fax:  [phone]  Physical Therapy Treatment  Patient Details  Name: Tracie Noble MRN: [id number] Date of Birth: [birth year] Referring Provider (PT): Edmonia Lynch, MD   Encounter Date: 04/30/2018  PT End of Session - 04/30/18 1431    Visit Number  9    Number of Visits  18    Date for PT Re-Evaluation  05/28/18    Authorization Type  Progress note every 10th visit    PT Start Time  1430    PT Stop Time  1516    PT Time Calculation (min)  46 min    Activity Tolerance  Patient tolerated treatment well    Behavior During Therapy  [facility] for tasks assessed/performed       Past Medical History:  Diagnosis Date  . Cancer ([place])    cervical at age 62     Past Surgical History:  Procedure Laterality Date  . ABDOMINAL HYSTERECTOMY    . JOINT REPLACEMENT     left- leg    There were no vitals filed for this visit.  Subjective Assessment - 04/30/18 1430    Subjective  Patient reports still feeling stiff.    Limitations  Walking;Standing;Sitting    Diagnostic tests  x-ray: medial compartment arthritis    Patient Stated Goals  decrease pain, walk normally    Currently in Pain?  No/denies         [facility] PT Assessment - 04/30/18 0001      Assessment   Medical Diagnosis  partial knee replacement    Referring Provider (PT)  Edmonia Lynch, MD    Onset Date/Surgical Date  04/03/18    Next MD Visit  05/14/2018    Prior Therapy  no      Precautions   Precautions  None      Restrictions   Weight Bearing Restrictions  No      PROM   Right Knee Flexion  104                   [facility] Adult PT Treatment/Exercise - 04/30/18 0001      Exercises   Exercises  Knee/Hip      Knee/Hip Exercises: Aerobic   Nustep  Level 4 x15 moving from seat 11 to 9      Knee/Hip Exercises: Standing   Forward Lunges  Right;15 reps;5 seconds    Rocker Board  --       Knee/Hip Exercises: Seated   Hamstring Curl  Strengthening;Right;20 reps    Hamstring Limitations  red       Knee/Hip Exercises: Supine   Short Arc Quad Sets  Strengthening;Right;2 sets;10 reps    Short Arc Quad Sets Limitations  1#    Heel Prop for Knee Extension  --      Vasopneumatic   Number Minutes Vasopneumatic   15 minutes    Vasopnuematic Location   Knee    Vasopneumatic Pressure  Low    Vasopneumatic Temperature   34      Manual Therapy   Manual Therapy  Passive ROM;Soft tissue mobilization    Soft tissue mobilization  --    Passive ROM  PROM into flexion with 5" hold at end range to improve ROM in sitting and in supine               PT Short Term Goals - 04/15/18 1535  PT SHORT TERM GOAL #1   Title  Patient will be independent with HEP    Time  3    Period  Weeks    Status  Achieved      PT SHORT TERM GOAL #2   Title  Patient will demonstrate 5 degrees or less of right knee extension AROM to improve gait mechanics    Time  3    Period  Weeks    Status  Achieved      PT SHORT TERM GOAL #3   Title  Patient will demonstrate 90+ degrees of right knee flexion AROM to improve ability to perform functional activities.    Time  3    Period  Weeks    Status  Achieved        PT Long Term Goals - 04/15/18 1535      PT LONG TERM GOAL #1   Title  Patient will be independent with advanced HEP    Time  6    Period  Weeks    Status  On-going      PT LONG TERM GOAL #2   Title  Patient will demonstrate 0 degrees of right knee extension AROM to improve gait mechanics.    Time  6    Period  Weeks    Status  On-going      PT LONG TERM GOAL #3   Title  Patient will demonstrate 115+ degrees of right knee flexion AROM to improve ability to perform funtional activities.    Time  6    Period  Weeks    Status  On-going      PT LONG TERM GOAL #4   Title  Patient will demonstrate 4+/5 or greater right knee MMT to improve stability during functional  tasks.    Time  6    Period  Weeks    Status  On-going      PT LONG TERM GOAL #5   Title  Patient will report ability to ambulate community distances with no AD and less than 3/10 right knee pain to perform work activities.    Time  6    Period  Weeks    Status  On-going            Plan - 04/30/18 1502    Clinical Impression Statement  Patient was able to tolerate treatment well however patient noted with increased pain in medial aspect of the right knee. Patient noted with guarding during PROM and with pain at end range, patient's right knee PROM to104 degrees. Normal response to modalities upon removal.     Clinical Presentation  Stable    Clinical Decision Making  Low    Rehab Potential  Excellent    PT Frequency  3x / week    PT Duration  6 weeks    PT Treatment/Interventions  ADLs/Self Care Home Management;Gait training;Stair training;Moist Heat;Cryotherapy;Lobbyist;Therapeutic exercise;Therapeutic activities;Functional mobility training;Neuromuscular re-education;Passive range of motion;Manual techniques;Patient/family education;Taping;Vasopneumatic Device    PT Next Visit Plan  nustep, knee AROM, PROM modalities PRN for pain relief    Consulted and Agree with Plan of Care  Patient       Patient will benefit from skilled therapeutic intervention in order to improve the following deficits and impairments:  Abnormal gait, Pain, Decreased activity tolerance, Decreased endurance, Decreased range of motion, Decreased strength, Increased edema, Difficulty walking, Improper body mechanics  Visit Diagnosis: Stiffness of right knee, not elsewhere classified  Difficulty in walking, not  elsewhere classified  Localized edema     Problem List Patient Active Problem List   Diagnosis Date Noted  . Primary osteoarthritis of right knee 12/20/2017   Gabriela Eves, PT, DPT 04/30/2018, 3:17 PM  [facility] [place], [place], [address] Phone: [phone]   Fax:  [phone]  Name: Tracie Noble MRN: [id number] Date of Birth: [birth year]

## 2018-05-05 ENCOUNTER — Encounter: Payer: Self-pay | Admitting: Physical Therapy

## 2018-05-05 ENCOUNTER — Ambulatory Visit: Payer: Managed Care, Other (non HMO) | Attending: Orthopedic Surgery | Admitting: Physical Therapy

## 2018-05-05 DIAGNOSIS — R600 Localized edema: Secondary | ICD-10-CM

## 2018-05-05 DIAGNOSIS — M25661 Stiffness of right knee, not elsewhere classified: Secondary | ICD-10-CM | POA: Insufficient documentation

## 2018-05-05 DIAGNOSIS — R262 Difficulty in walking, not elsewhere classified: Secondary | ICD-10-CM | POA: Insufficient documentation

## 2018-05-05 NOTE — Therapy (Signed)
[facility] [place], [place], [address] Phone: [phone]   Fax:  [phone]  Physical Therapy Treatment   Progress Note Reporting Period 04/09/18 to 05/05/18  See note below for Objective Data and Assessment of Progress/Goals.      Patient Details  Name: Tracie Noble MRN: [id number] Date of Birth: 24-May-1956 Referring Provider (PT): Edmonia Lynch, MD   Encounter Date: 05/05/2018  PT End of Session - 05/05/18 1526    Visit Number  10    Number of Visits  18    Date for PT Re-Evaluation  05/28/18    Authorization Type  Progress note every 10th visit    PT Start Time  1515    PT Stop Time  1611    PT Time Calculation (min)  56 min    Activity Tolerance  Patient tolerated treatment well    Behavior During Therapy  [facility] for tasks assessed/performed       Past Medical History:  Diagnosis Date  . Cancer ([place])    cervical at age 34     Past Surgical History:  Procedure Laterality Date  . ABDOMINAL HYSTERECTOMY    . JOINT REPLACEMENT     left- leg    There were no vitals filed for this visit.      [facility] PT Assessment - 05/05/18 0001      Assessment   Medical Diagnosis  partial knee replacement    Referring Provider (PT)  Edmonia Lynch, MD    Onset Date/Surgical Date  04/03/18    Next MD Visit  05/14/2018    Prior Therapy  no      Precautions   Precautions  None      Restrictions   Weight Bearing Restrictions  No      ROM / Strength   AROM / PROM / Strength  Strength      Strength   Strength Assessment Site  Knee    Right/Left Knee  Right    Right Knee Flexion  4/5    Right Knee Extension  4/5                   [facility] Adult PT Treatment/Exercise - 05/05/18 0001      Ambulation/Gait   Ambulation Distance (Feet)  --   carpeted hall way x5   Assistive device  None    Gait Pattern  Decreased stance time - right;Decreased step length - left;Decreased hip/knee flexion -  right;Decreased weight shift to right;Lateral trunk lean to left    Gait Comments  verbal cuing and visual cuing for increased knee and hip flexion during swing      Exercises   Exercises  Knee/Hip      Knee/Hip Exercises: Aerobic   Nustep  Level 5 x15 moving from seat 13 to 9      Knee/Hip Exercises: Standing   Hip Flexion  AROM;Right;10 reps;Knee bent    Hip Abduction  AROM;Right;10 reps;Knee straight      Knee/Hip Exercises: Seated   Long Arc Quad  AROM;Right;20 reps    Long Arc Quad Weight  2 lbs.    Marching  Strengthening;20 reps    [employer]  2 lbs.    Sit to Sand  5 reps;with UE support   varying height chairs     Vasopneumatic   Number Minutes Vasopneumatic   10 minutes    Vasopnuematic Location   Knee    Vasopneumatic Pressure  Low  Vasopneumatic Temperature   57      Manual Therapy   Manual Therapy  --    Passive ROM  --             PT Education - 05/05/18 1621    Education Details  marching hip abduction sit to stands    Person(s) Educated  Patient    Methods  Explanation;Demonstration;Handout    Comprehension  Verbalized understanding;Returned demonstration       PT Short Term Goals - 04/15/18 1535      PT SHORT TERM GOAL #1   Title  Patient will be independent with HEP    Time  3    Period  Weeks    Status  Achieved      PT SHORT TERM GOAL #2   Title  Patient will demonstrate 5 degrees or less of right knee extension AROM to improve gait mechanics    Time  3    Period  Weeks    Status  Achieved      PT SHORT TERM GOAL #3   Title  Patient will demonstrate 90+ degrees of right knee flexion AROM to improve ability to perform functional activities.    Time  3    Period  Weeks    Status  Achieved        PT Long Term Goals - 05/05/18 1602      PT LONG TERM GOAL #1   Title  Patient will be independent with advanced HEP    Time  6    Period  Weeks    Status  On-going      PT LONG TERM GOAL #2   Title  Patient will  demonstrate 0 degrees of right knee extension AROM to improve gait mechanics.    Time  6    Period  Weeks    Status  On-going      PT LONG TERM GOAL #3   Title  Patient will demonstrate 115+ degrees of right knee flexion AROM to improve ability to perform funtional activities.    Period  Weeks    Status  On-going      PT LONG TERM GOAL #4   Title  Patient will demonstrate 4+/5 or greater right knee MMT to improve stability during functional tasks.    Time  6    Period  Weeks    Status  On-going      PT LONG TERM GOAL #5   Title  Patient will report ability to ambulate community distances with no AD and less than 3/10 right knee pain to perform work activities.    Time  6    Period  Weeks    Status  On-going            Plan - 05/05/18 1626    Clinical Impression Statement  Patient was able to tolerate treatment fairly well with reports of tightness in medial and anterior aspect of the right knee. Patient continues to ambulate with decreased right knee flexion during swing, decreased stance time and weight shift on right and decreased left step length. Patient provided with new HEP to perform at home and was instructed on how to peform scar massaging. Patient reported understanding.     Clinical Presentation  Stable    Clinical Decision Making  Low    Rehab Potential  Excellent    PT Frequency  3x / week    PT Duration  6 weeks    PT Treatment/Interventions  ADLs/Self Care Home Management;Gait training;Stair training;Moist Heat;Cryotherapy;Lobbyist;Therapeutic exercise;Therapeutic activities;Functional mobility training;Neuromuscular re-education;Passive range of motion;Manual techniques;Patient/family education;Taping;Vasopneumatic Device    PT Next Visit Plan  nustep, knee AROM, PROM modalities PRN for pain relief    Consulted and Agree with Plan of Care  Patient       Patient will benefit from skilled therapeutic intervention in order to  improve the following deficits and impairments:  Abnormal gait, Pain, Decreased activity tolerance, Decreased endurance, Decreased range of motion, Decreased strength, Increased edema, Difficulty walking, Improper body mechanics  Visit Diagnosis: Stiffness of right knee, not elsewhere classified  Localized edema  Difficulty in walking, not elsewhere classified     Problem List Patient Active Problem List   Diagnosis Date Noted  . Primary osteoarthritis of right knee 12/20/2017   Gabriela Eves, PT, DPT 05/05/2018, 4:34 PM  [facility] [place], [place], [address] Phone: [phone]   Fax:  [phone]  Name: Tracie Noble MRN: [id number] Date of Birth: 02/27/1956

## 2018-05-07 ENCOUNTER — Encounter: Payer: Self-pay | Admitting: Physical Therapy

## 2018-05-07 ENCOUNTER — Ambulatory Visit: Payer: Managed Care, Other (non HMO) | Admitting: Physical Therapy

## 2018-05-07 DIAGNOSIS — R262 Difficulty in walking, not elsewhere classified: Secondary | ICD-10-CM

## 2018-05-07 DIAGNOSIS — M25661 Stiffness of right knee, not elsewhere classified: Secondary | ICD-10-CM

## 2018-05-07 DIAGNOSIS — R600 Localized edema: Secondary | ICD-10-CM

## 2018-05-07 NOTE — Therapy (Signed)
[facility] [place], [place], [address] Phone: [phone]   Fax:  [phone]  Physical Therapy Treatment  Patient Details  Name: Tracie Noble MRN: [id number] Date of Birth: 09-Jun-1955 Referring Provider (PT): Edmonia Lynch, MD   Encounter Date: 05/07/2018  PT End of Session - 05/07/18 1521    Visit Number  11    Number of Visits  18    Date for PT Re-Evaluation  05/28/18    Authorization Type  Progress note every 10th visit    PT Start Time  1430    PT Stop Time  1522    PT Time Calculation (min)  52 min    Activity Tolerance  Patient tolerated treatment well    Behavior During Therapy  [facility] for tasks assessed/performed       Past Medical History:  Diagnosis Date  . Cancer ([place])    cervical at age 66     Past Surgical History:  Procedure Laterality Date  . ABDOMINAL HYSTERECTOMY    . JOINT REPLACEMENT     left- leg    There were no vitals filed for this visit.  Subjective Assessment - 05/07/18 1522    Subjective  Patient reported feeling sore after last visit but feels the new exercises helped loosen up her knee    Limitations  Walking;Standing;Sitting    Diagnostic tests  x-ray: medial compartment arthritis    Patient Stated Goals  decrease pain, walk normally    Currently in Pain?  No/denies         [facility] PT Assessment - 05/07/18 0001      Assessment   Medical Diagnosis  partial knee replacement    Referring Provider (PT)  Edmonia Lynch, MD    Onset Date/Surgical Date  04/03/18    Next MD Visit  05/14/2018    Prior Therapy  no      Precautions   Precautions  None      Restrictions   Weight Bearing Restrictions  No                   [facility] Adult PT Treatment/Exercise - 05/07/18 0001      Exercises   Exercises  Knee/Hip      Knee/Hip Exercises: Aerobic   Nustep  Level 5 x15 moving from seat 10 to 8      Knee/Hip Exercises: Machines for Strengthening   Cybex Knee  Extension  10# 16mins    Cybex Knee Flexion  20# 2 mins      Knee/Hip Exercises: Standing   Wall Squat  10 reps      Vasopneumatic   Number Minutes Vasopneumatic   15 minutes    Vasopnuematic Location   Knee    Vasopneumatic Pressure  Low    Vasopneumatic Temperature   56               PT Short Term Goals - 04/15/18 1535      PT SHORT TERM GOAL #1   Title  Patient will be independent with HEP    Time  3    Period  Weeks    Status  Achieved      PT SHORT TERM GOAL #2   Title  Patient will demonstrate 5 degrees or less of right knee extension AROM to improve gait mechanics    Time  3    Period  Weeks    Status  Achieved      PT SHORT  TERM GOAL #3   Title  Patient will demonstrate 90+ degrees of right knee flexion AROM to improve ability to perform functional activities.    Time  3    Period  Weeks    Status  Achieved        PT Long Term Goals - 05/05/18 1602      PT LONG TERM GOAL #1   Title  Patient will be independent with advanced HEP    Time  6    Period  Weeks    Status  On-going      PT LONG TERM GOAL #2   Title  Patient will demonstrate 0 degrees of right knee extension AROM to improve gait mechanics.    Time  6    Period  Weeks    Status  On-going      PT LONG TERM GOAL #3   Title  Patient will demonstrate 115+ degrees of right knee flexion AROM to improve ability to perform funtional activities.    Period  Weeks    Status  On-going      PT LONG TERM GOAL #4   Title  Patient will demonstrate 4+/5 or greater right knee MMT to improve stability during functional tasks.    Time  6    Period  Weeks    Status  On-going      PT LONG TERM GOAL #5   Title  Patient will report ability to ambulate community distances with no AD and less than 3/10 right knee pain to perform work activities.    Time  6    Period  Weeks    Status  On-going            Plan - 05/07/18 1523    Clinical Impression Statement  Paitent was able to tolerate  treatment well with minimal reports of pain. Patient was able to perform advanced strengthening exercises with good form and technique. Normal response to modalities upon removal. Patient to see MD on 05/14/18, assess goals and ROM next visit.    Clinical Presentation  Stable    Clinical Decision Making  Low    Rehab Potential  Excellent    PT Frequency  3x / week    PT Duration  6 weeks    PT Treatment/Interventions  ADLs/Self Care Home Management;Gait training;Stair training;Moist Heat;Cryotherapy;Lobbyist;Therapeutic exercise;Therapeutic activities;Functional mobility training;Neuromuscular re-education;Passive range of motion;Manual techniques;Patient/family education;Taping;Vasopneumatic Device    PT Next Visit Plan  Assess goals and ROM, MD note; nustep, knee AROM, PROM modalities PRN for pain relief    Consulted and Agree with Plan of Care  Patient       Patient will benefit from skilled therapeutic intervention in order to improve the following deficits and impairments:  Abnormal gait, Pain, Decreased activity tolerance, Decreased endurance, Decreased range of motion, Decreased strength, Increased edema, Difficulty walking, Improper body mechanics  Visit Diagnosis: Stiffness of right knee, not elsewhere classified  Localized edema  Difficulty in walking, not elsewhere classified     Problem List Patient Active Problem List   Diagnosis Date Noted  . Primary osteoarthritis of right knee 12/20/2017   Gabriela Eves, PT, DPT 05/07/2018, 3:31 PM  [facility] [place], [place], [address] Phone: [phone]   Fax:  [phone]  Name: Tracie Noble MRN: [id number] Date of Birth: 10-15-1955

## 2018-05-08 ENCOUNTER — Ambulatory Visit: Payer: Managed Care, Other (non HMO) | Admitting: Physical Therapy

## 2018-05-08 ENCOUNTER — Encounter: Payer: Self-pay | Admitting: Physical Therapy

## 2018-05-08 DIAGNOSIS — M25661 Stiffness of right knee, not elsewhere classified: Secondary | ICD-10-CM | POA: Diagnosis not present

## 2018-05-08 DIAGNOSIS — R600 Localized edema: Secondary | ICD-10-CM

## 2018-05-08 DIAGNOSIS — R262 Difficulty in walking, not elsewhere classified: Secondary | ICD-10-CM

## 2018-05-08 NOTE — Therapy (Signed)
[facility] [place], [place], [address] Phone: [phone]   Fax:  [phone]  Physical Therapy Treatment  Patient Details  Name: Tracie Noble MRN: [id number] Date of Birth: 18-Dec-1955 Referring Provider (PT): Edmonia Lynch, MD   Encounter Date: 05/08/2018  PT End of Session - 05/08/18 1521    Visit Number  12    Number of Visits  18    Date for PT Re-Evaluation  05/28/18    Authorization Type  Progress note every 10th visit    PT Start Time  1515    PT Stop Time  1612    PT Time Calculation (min)  57 min    Activity Tolerance  Patient tolerated treatment well    Behavior During Therapy  [facility] for tasks assessed/performed       Past Medical History:  Diagnosis Date  . Cancer ([place])    cervical at age 54     Past Surgical History:  Procedure Laterality Date  . ABDOMINAL HYSTERECTOMY    . JOINT REPLACEMENT     left- leg    There were no vitals filed for this visit.  Subjective Assessment - 05/08/18 1523    Subjective  Patient reported feeling sore due to new exercises but the soreness worked its way out.    Patient is accompained by:  Family member    Limitations  Walking;Standing;Sitting    Diagnostic tests  x-ray: medial compartment arthritis    Patient Stated Goals  decrease pain, walk normally    Currently in Pain?  No/denies         [facility] PT Assessment - 05/08/18 0001      Assessment   Medical Diagnosis  partial knee replacement    Referring Provider (PT)  Edmonia Lynch, MD    Onset Date/Surgical Date  04/03/18    Next MD Visit  05/14/2018    Prior Therapy  no      Precautions   Precautions  None      AROM   Right Knee Extension  1    Right Knee Flexion  109      Strength   Right Knee Flexion  4/5    Right Knee Extension  4/5                   [facility] Adult PT Treatment/Exercise - 05/08/18 0001      Knee/Hip Exercises: Aerobic   Nustep  Level 5 x15 moving from seat 10 to  7      Knee/Hip Exercises: Machines for Strengthening   Cybex Knee Extension  10# 58mins    Cybex Knee Flexion  20# 2 mins      Vasopneumatic   Number Minutes Vasopneumatic   15 minutes    Vasopnuematic Location   Knee    Vasopneumatic Pressure  Low      Manual Therapy   Manual Therapy  Passive ROM    Passive ROM  PROM into flexion and extension to improve ROM; oscillations provided to decrease muscle guarding               PT Short Term Goals - 04/15/18 1535      PT SHORT TERM GOAL #1   Title  Patient will be independent with HEP    Time  3    Period  Weeks    Status  Achieved      PT SHORT TERM GOAL #2   Title  Patient will demonstrate 5  degrees or less of right knee extension AROM to improve gait mechanics    Time  3    Period  Weeks    Status  Achieved      PT SHORT TERM GOAL #3   Title  Patient will demonstrate 90+ degrees of right knee flexion AROM to improve ability to perform functional activities.    Time  3    Period  Weeks    Status  Achieved        PT Long Term Goals - 05/05/18 1602      PT LONG TERM GOAL #1   Title  Patient will be independent with advanced HEP    Time  6    Period  Weeks    Status  On-going      PT LONG TERM GOAL #2   Title  Patient will demonstrate 0 degrees of right knee extension AROM to improve gait mechanics.    Time  6    Period  Weeks    Status  On-going      PT LONG TERM GOAL #3   Title  Patient will demonstrate 115+ degrees of right knee flexion AROM to improve ability to perform funtional activities.    Period  Weeks    Status  On-going      PT LONG TERM GOAL #4   Title  Patient will demonstrate 4+/5 or greater right knee MMT to improve stability during functional tasks.    Time  6    Period  Weeks    Status  On-going      PT LONG TERM GOAL #5   Title  Patient will report ability to ambulate community distances with no AD and less than 3/10 right knee pain to perform work activities.    Time  6     Period  Weeks    Status  On-going            Plan - 05/08/18 2047    Clinical Impression Statement  Patient was able to tolerate treatment well but did report intermittent soreness in medial aspect of the right knee. Patient continues to demonstrate intermittent guarding throughout knee flexion PROM but noted with decreased guarding after oscillations. Patient's AROM 1-109 degrees. Patient's goals are ongoing at this time. Normal response to modalities at end of session.. MD note sent for follow up visit on 05/14/18.    Clinical Presentation  Stable    Clinical Decision Making  Low    Rehab Potential  Excellent    PT Frequency  3x / week    PT Duration  6 weeks    PT Treatment/Interventions  ADLs/Self Care Home Management;Gait training;Stair training;Moist Heat;Cryotherapy;Lobbyist;Therapeutic exercise;Therapeutic activities;Functional mobility training;Neuromuscular re-education;Passive range of motion;Manual techniques;Patient/family education;Taping;Vasopneumatic Device    PT Next Visit Plan  continue POC attempt bike; nustep, knee AROM, PROM modalities PRN for pain relief    Consulted and Agree with Plan of Care  Patient       Patient will benefit from skilled therapeutic intervention in order to improve the following deficits and impairments:  Abnormal gait, Pain, Decreased activity tolerance, Decreased endurance, Decreased range of motion, Decreased strength, Increased edema, Difficulty walking, Improper body mechanics  Visit Diagnosis: Stiffness of right knee, not elsewhere classified  Localized edema  Difficulty in walking, not elsewhere classified     Problem List Patient Active Problem List   Diagnosis Date Noted  . Primary osteoarthritis of right knee 12/20/2017   Gabriela Eves, PT, DPT  05/08/2018, 9:14 PM  [facility] [place], [place], [address] Phone: [phone]    Fax:  [phone]  Name: Tracie Noble MRN: [id number] Date of Birth: 06/04/1956

## 2018-05-12 ENCOUNTER — Encounter: Payer: Self-pay | Admitting: Physical Therapy

## 2018-05-12 ENCOUNTER — Ambulatory Visit: Payer: Managed Care, Other (non HMO) | Admitting: Physical Therapy

## 2018-05-12 DIAGNOSIS — M25661 Stiffness of right knee, not elsewhere classified: Secondary | ICD-10-CM | POA: Diagnosis not present

## 2018-05-12 DIAGNOSIS — R600 Localized edema: Secondary | ICD-10-CM

## 2018-05-12 DIAGNOSIS — R262 Difficulty in walking, not elsewhere classified: Secondary | ICD-10-CM

## 2018-05-12 NOTE — Therapy (Signed)
[facility] [place], [place], [address] Phone: [phone]   Fax:  [phone]  Physical Therapy Treatment  Patient Details  Name: Tracie Noble MRN: [id number] Date of Birth: 15-May-1956 Referring Provider (PT): Edmonia Lynch, MD   Encounter Date: 05/12/2018  PT End of Session - 05/12/18 1554    Visit Number  13    Number of Visits  18    Date for PT Re-Evaluation  05/28/18    Authorization Type  Progress note every 10th visit    PT Start Time  1430    PT Stop Time  1520    PT Time Calculation (min)  50 min    Activity Tolerance  Patient tolerated treatment well    Behavior During Therapy  [facility] for tasks assessed/performed       Past Medical History:  Diagnosis Date  . Cancer ([place])    cervical at age 41     Past Surgical History:  Procedure Laterality Date  . ABDOMINAL HYSTERECTOMY    . JOINT REPLACEMENT     left- leg    There were no vitals filed for this visit.  Subjective Assessment - 05/12/18 1439    Subjective  Patient reports feeling good with no reports of soreness.    Patient is accompained by:  Family member    Limitations  Walking;Standing;Sitting    Diagnostic tests  x-ray: medial compartment arthritis    Patient Stated Goals  decrease pain, walk normally    Currently in Pain?  No/denies         [facility] PT Assessment - 05/12/18 0001      Assessment   Medical Diagnosis  partial knee replacement    Referring Provider (PT)  Edmonia Lynch, MD    Onset Date/Surgical Date  04/03/18    Next MD Visit  05/14/2018    Prior Therapy  no      Precautions   Precautions  None                   [facility] Adult PT Treatment/Exercise - 05/12/18 0001      Exercises   Exercises  Knee/Hip      Knee/Hip Exercises: Aerobic   Stationary Bike  x15 minutes; seat 9      Knee/Hip Exercises: Machines for Strengthening   Cybex Knee Extension  10# 3 mins    Cybex Knee Flexion  20# 3 mins    Cybex  Leg Press  1.5 plates 3 mins      Knee/Hip Exercises: Standing   Forward Step Up  Right;2 sets;Step Height: 6";Step Height: 8"    Step Down  Step Height: 4";Right;2 sets;10 reps      Vasopneumatic   Number Minutes Vasopneumatic   15 minutes    Vasopnuematic Location   Knee    Vasopneumatic Pressure  Low      Manual Therapy   Manual Therapy  --    Passive ROM  --               PT Short Term Goals - 04/15/18 1535      PT SHORT TERM GOAL #1   Title  Patient will be independent with HEP    Time  3    Period  Weeks    Status  Achieved      PT SHORT TERM GOAL #2   Title  Patient will demonstrate 5 degrees or less of right knee extension AROM to improve gait mechanics  Time  3    Period  Weeks    Status  Achieved      PT SHORT TERM GOAL #3   Title  Patient will demonstrate 90+ degrees of right knee flexion AROM to improve ability to perform functional activities.    Time  3    Period  Weeks    Status  Achieved        PT Long Term Goals - 05/05/18 1602      PT LONG TERM GOAL #1   Title  Patient will be independent with advanced HEP    Time  6    Period  Weeks    Status  On-going      PT LONG TERM GOAL #2   Title  Patient will demonstrate 0 degrees of right knee extension AROM to improve gait mechanics.    Time  6    Period  Weeks    Status  On-going      PT LONG TERM GOAL #3   Title  Patient will demonstrate 115+ degrees of right knee flexion AROM to improve ability to perform funtional activities.    Period  Weeks    Status  On-going      PT LONG TERM GOAL #4   Title  Patient will demonstrate 4+/5 or greater right knee MMT to improve stability during functional tasks.    Time  6    Period  Weeks    Status  On-going      PT LONG TERM GOAL #5   Title  Patient will report ability to ambulate community distances with no AD and less than 3/10 right knee pain to perform work activities.    Time  6    Period  Weeks    Status  On-going             Plan - 05/12/18 1955    Clinical Impression Statement  Patient was able to tolerate progression of treatment well. Patient was able to perform full revolutions on the bike with minimal complaints. Patient was able to increase time on knee extension and flexion machine. Patient still complains of rightness in right knee during end range flexion. Normal response to modalities upon removal.     Clinical Presentation  Stable    Clinical Decision Making  Low    Rehab Potential  Excellent    PT Frequency  3x / week    PT Duration  6 weeks    PT Treatment/Interventions  ADLs/Self Care Home Management;Gait training;Stair training;Moist Heat;Cryotherapy;Lobbyist;Therapeutic exercise;Therapeutic activities;Functional mobility training;Neuromuscular re-education;Passive range of motion;Manual techniques;Patient/family education;Taping;Vasopneumatic Device    PT Next Visit Plan  continue bike; nustep, knee AROM, PROM modalities PRN for pain relief    Consulted and Agree with Plan of Care  Patient       Patient will benefit from skilled therapeutic intervention in order to improve the following deficits and impairments:  Abnormal gait, Pain, Decreased activity tolerance, Decreased endurance, Decreased range of motion, Decreased strength, Increased edema, Difficulty walking, Improper body mechanics  Visit Diagnosis: Stiffness of right knee, not elsewhere classified  Localized edema  Difficulty in walking, not elsewhere classified     Problem List Patient Active Problem List   Diagnosis Date Noted  . Primary osteoarthritis of right knee 12/20/2017   Gabriela Eves, PT, DPT 05/12/2018, 8:05 PM  [facility] [address] [place], [place], [address] Phone: [phone]   Fax:  [phone]  Name: SINDI BECKWORTH  MRN: [id number] Date of Birth: [birth year]

## 2018-05-13 ENCOUNTER — Ambulatory Visit: Payer: Managed Care, Other (non HMO) | Admitting: Physical Therapy

## 2018-05-13 ENCOUNTER — Encounter: Payer: Self-pay | Admitting: Physical Therapy

## 2018-05-13 DIAGNOSIS — M25661 Stiffness of right knee, not elsewhere classified: Secondary | ICD-10-CM

## 2018-05-13 DIAGNOSIS — R600 Localized edema: Secondary | ICD-10-CM

## 2018-05-13 DIAGNOSIS — R262 Difficulty in walking, not elsewhere classified: Secondary | ICD-10-CM

## 2018-05-13 NOTE — Therapy (Addendum)
[facility] [place], [place], [address] Phone: [phone]   Fax:  [phone]  Physical Therapy Treatment  Patient Details  Name: Tracie Noble MRN: [id number] Date of Birth: August 27, 1955 Referring Provider (PT): Edmonia Lynch, MD   Encounter Date: 05/13/2018  PT End of Session - 05/13/18 1057    Visit Number  14    Number of Visits  18    Date for PT Re-Evaluation  05/28/18    Authorization Type  Progress note every 10th visit    PT Start Time  1030    PT Stop Time  1122    PT Time Calculation (min)  52 min    Activity Tolerance  Patient tolerated treatment well    Behavior During Therapy  [facility] for tasks assessed/performed       Past Medical History:  Diagnosis Date  . Cancer ([place])    cervical at age 62     Past Surgical History:  Procedure Laterality Date  . ABDOMINAL HYSTERECTOMY    . JOINT REPLACEMENT     left- leg    There were no vitals filed for this visit.  Subjective Assessment - 05/13/18 1052    Subjective  Patient reported feeling tight in the right knee today.    Patient is accompained by:  Family member    Limitations  Walking;Standing;Sitting    Diagnostic tests  x-ray: medial compartment arthritis    Patient Stated Goals  decrease pain, walk normally    Currently in Pain?  No/denies         [facility] PT Assessment - 05/13/18 0001      Assessment   Medical Diagnosis  partial knee replacement    Referring Provider (PT)  Edmonia Lynch, MD    Onset Date/Surgical Date  04/03/18    Next MD Visit  05/14/2018    Prior Therapy  no                   [facility] Adult PT Treatment/Exercise - 05/13/18 0001      Exercises   Exercises  Knee/Hip      Knee/Hip Exercises: Aerobic   Stationary Bike  x15 minutes; seat 9      Knee/Hip Exercises: Machines for Strengthening   Cybex Knee Extension  20# 3 mins    Cybex Knee Flexion  30# 3 mins    Cybex Leg Press  1.5 plates 3 mins       Vasopneumatic   Number Minutes Vasopneumatic   15 minutes    Vasopnuematic Location   Knee    Vasopneumatic Pressure  Low    Vasopneumatic Temperature   56      Manual Therapy   Manual Therapy  Joint mobilization;Soft tissue mobilization    Joint Mobilization  patella mobs in all planes to improve ROM    Soft tissue mobilization  Scar massage to scar to improve scar mobility.               PT Short Term Goals - 04/15/18 1535      PT SHORT TERM GOAL #1   Title  Patient will be independent with HEP    Time  3    Period  Weeks    Status  Achieved      PT SHORT TERM GOAL #2   Title  Patient will demonstrate 5 degrees or less of right knee extension AROM to improve gait mechanics    Time  3  Period  Weeks    Status  Achieved      PT SHORT TERM GOAL #3   Title  Patient will demonstrate 90+ degrees of right knee flexion AROM to improve ability to perform functional activities.    Time  3    Period  Weeks    Status  Achieved        PT Long Term Goals - 05/05/18 1602      PT LONG TERM GOAL #1   Title  Patient will be independent with advanced HEP    Time  6    Period  Weeks    Status  On-going      PT LONG TERM GOAL #2   Title  Patient will demonstrate 0 degrees of right knee extension AROM to improve gait mechanics.    Time  6    Period  Weeks    Status  On-going      PT LONG TERM GOAL #3   Title  Patient will demonstrate 115+ degrees of right knee flexion AROM to improve ability to perform funtional activities.    Period  Weeks    Status  On-going      PT LONG TERM GOAL #4   Title  Patient will demonstrate 4+/5 or greater right knee MMT to improve stability during functional tasks.    Time  6    Period  Weeks    Status  On-going      PT LONG TERM GOAL #5   Title  Patient will report ability to ambulate community distances with no AD and less than 3/10 right knee pain to perform work activities.    Time  6    Period  Weeks    Status  On-going             Plan - 05/13/18 1206    Clinical Impression Statement  Patient was able to tolerate progression of treatment well with no reports of pain, just fatigue. Patient still complains of right knee tightness with end range flexion. Patient noted with improved scar mobility and patient reported being compliant with scar massaging at home. Normal response to modalities upon removal.     Clinical Presentation  Stable    Clinical Decision Making  Low    Rehab Potential  Excellent    PT Frequency  3x / week    PT Duration  6 weeks    PT Treatment/Interventions  ADLs/Self Care Home Management;Gait training;Stair training;Moist Heat;Cryotherapy;Lobbyist;Therapeutic exercise;Therapeutic activities;Functional mobility training;Neuromuscular re-education;Passive range of motion;Manual techniques;Patient/family education;Taping;Vasopneumatic Device    PT Next Visit Plan  cont e-stim/US combo; bike; nustep, knee AROM, PROM modalities PRN for pain relief    Consulted and Agree with Plan of Care  Patient     **Error on PT Next visit plan no e-stim/US combo**  Patient will benefit from skilled therapeutic intervention in order to improve the following deficits and impairments:  Abnormal gait, Pain, Decreased activity tolerance, Decreased endurance, Decreased range of motion, Decreased strength, Increased edema, Difficulty walking, Improper body mechanics  Visit Diagnosis: Stiffness of right knee, not elsewhere classified  Localized edema  Difficulty in walking, not elsewhere classified     Problem List Patient Active Problem List   Diagnosis Date Noted  . Primary osteoarthritis of right knee 12/20/2017    Gabriela Eves, PT, DPT 05/13/2018, 12:19 PM  [facility] [address] [place], [place], [address] Phone: [phone]   Fax:  [phone]  Name: Tracie  TYNESIA Noble MRN: [id number] Date of Birth:  07-14-1955

## 2018-05-15 ENCOUNTER — Encounter: Payer: Self-pay | Admitting: Physical Therapy

## 2018-05-15 ENCOUNTER — Ambulatory Visit: Payer: Managed Care, Other (non HMO) | Admitting: Physical Therapy

## 2018-05-15 DIAGNOSIS — M25661 Stiffness of right knee, not elsewhere classified: Secondary | ICD-10-CM | POA: Diagnosis not present

## 2018-05-15 DIAGNOSIS — R262 Difficulty in walking, not elsewhere classified: Secondary | ICD-10-CM

## 2018-05-15 DIAGNOSIS — R600 Localized edema: Secondary | ICD-10-CM

## 2018-05-15 NOTE — Therapy (Signed)
[facility] [place], [place], [address] Phone: [phone]   Fax:  [phone]  Physical Therapy Treatment  Patient Details  Name: Tracie Noble MRN: [id number] Date of Birth: 1955/10/22 Referring Provider (PT): Edmonia Lynch, MD   Encounter Date: 05/15/2018  PT End of Session - 05/15/18 1443    Visit Number  15    Number of Visits  18    Date for PT Re-Evaluation  05/28/18    Authorization Type  Progress note every 10th visit    PT Start Time  1430    PT Stop Time  1531    PT Time Calculation (min)  61 min    Activity Tolerance  Patient tolerated treatment well    Behavior During Therapy  [facility] for tasks assessed/performed       Past Medical History:  Diagnosis Date  . Cancer ([place])    cervical at age 49     Past Surgical History:  Procedure Laterality Date  . ABDOMINAL HYSTERECTOMY    . JOINT REPLACEMENT     left- leg    There were no vitals filed for this visit.  Subjective Assessment - 05/15/18 1442    Subjective  Patient reported MD appointment went well but still needs to work on knee flexion. She can return to work for a half day on Monday.    Limitations  Walking;Standing;Sitting    Diagnostic tests  x-ray: medial compartment arthritis    Patient Stated Goals  decrease pain, walk normally    Currently in Pain?  No/denies         [facility] PT Assessment - 05/15/18 0001      Assessment   Medical Diagnosis  partial knee replacement    Referring Provider (PT)  Edmonia Lynch, MD    Onset Date/Surgical Date  04/03/18    Next MD Visit  Jun 11, 2018                   [facility] Adult PT Treatment/Exercise - 05/15/18 0001      Exercises   Exercises  Knee/Hip      Knee/Hip Exercises: Stretches   Knee: Self-Stretch to increase Flexion  2 reps;60 seconds;Other (comment)    Knee: Self-Stretch Limitations  on leg press machine seat 4      Knee/Hip Exercises: Aerobic   Stationary Bike  x15  minutes; seat 6      Knee/Hip Exercises: Machines for Strengthening   Cybex Knee Extension  20# 3 mins    Cybex Knee Flexion  30# 3 mins    Cybex Leg Press  1.5 plates 3 mins      Modalities   Modalities  Cryotherapy      Cryotherapy   Number Minutes Cryotherapy  15 Minutes    Cryotherapy Location  Knee    Type of Cryotherapy  Ice pack      Manual Therapy   Manual Therapy  Soft tissue mobilization    Soft tissue mobilization  STW/M to right quad to decrease pain and decrease edema               PT Short Term Goals - 04/15/18 1535      PT SHORT TERM GOAL #1   Title  Patient will be independent with HEP    Time  3    Period  Weeks    Status  Achieved      PT SHORT TERM GOAL #2   Title  Patient  will demonstrate 5 degrees or less of right knee extension AROM to improve gait mechanics    Time  3    Period  Weeks    Status  Achieved      PT SHORT TERM GOAL #3   Title  Patient will demonstrate 90+ degrees of right knee flexion AROM to improve ability to perform functional activities.    Time  3    Period  Weeks    Status  Achieved        PT Long Term Goals - 05/05/18 1602      PT LONG TERM GOAL #1   Title  Patient will be independent with advanced HEP    Time  6    Period  Weeks    Status  On-going      PT LONG TERM GOAL #2   Title  Patient will demonstrate 0 degrees of right knee extension AROM to improve gait mechanics.    Time  6    Period  Weeks    Status  On-going      PT LONG TERM GOAL #3   Title  Patient will demonstrate 115+ degrees of right knee flexion AROM to improve ability to perform funtional activities.    Period  Weeks    Status  On-going      PT LONG TERM GOAL #4   Title  Patient will demonstrate 4+/5 or greater right knee MMT to improve stability during functional tasks.    Time  6    Period  Weeks    Status  On-going      PT LONG TERM GOAL #5   Title  Patient will report ability to ambulate community distances with no AD and  less than 3/10 right knee pain to perform work activities.    Time  6    Period  Weeks    Status  On-going            Plan - 05/15/18 1756    Clinical Impression Statement  Patient was able to tolerate treatment well. Patient noted with increased soreness with knee flexion stretch on leg press but was able to tolerate all repetitions. Per MD, emphasis on knee flexion for remaining visits to improve knee flexion AROM.  Normal response to ice pack at end of session, vasopneumatic device not available.    Clinical Presentation  Stable    Clinical Decision Making  Low    Rehab Potential  Excellent    PT Frequency  3x / week    PT Duration  6 weeks    PT Treatment/Interventions  ADLs/Self Care Home Management;Gait training;Stair training;Moist Heat;Cryotherapy;Lobbyist;Therapeutic exercise;Therapeutic activities;Functional mobility training;Neuromuscular re-education;Passive range of motion;Manual techniques;Patient/family education;Taping;Vasopneumatic Device    PT Next Visit Plan  emphasis on flexion exercises. bike; nustep, knee AROM, PROM modalities PRN for pain relief    Consulted and Agree with Plan of Care  Patient       Patient will benefit from skilled therapeutic intervention in order to improve the following deficits and impairments:  Abnormal gait, Pain, Decreased activity tolerance, Decreased endurance, Decreased range of motion, Decreased strength, Increased edema, Difficulty walking, Improper body mechanics  Visit Diagnosis: Stiffness of right knee, not elsewhere classified  Localized edema  Difficulty in walking, not elsewhere classified     Problem List Patient Active Problem List   Diagnosis Date Noted  . Primary osteoarthritis of right knee 12/20/2017   Gabriela Eves, PT, DPT 05/15/2018, 6:02 PM  [facility] [place], [place], [address] Phone: [phone]   Fax:   [phone]  Name: Tracie Noble MRN: [id number] Date of Birth: 1956/05/18

## 2018-05-19 ENCOUNTER — Ambulatory Visit: Payer: Managed Care, Other (non HMO) | Admitting: Physical Therapy

## 2018-05-19 ENCOUNTER — Encounter: Payer: Self-pay | Admitting: Physical Therapy

## 2018-05-19 DIAGNOSIS — R262 Difficulty in walking, not elsewhere classified: Secondary | ICD-10-CM

## 2018-05-19 DIAGNOSIS — R600 Localized edema: Secondary | ICD-10-CM

## 2018-05-19 DIAGNOSIS — M25661 Stiffness of right knee, not elsewhere classified: Secondary | ICD-10-CM

## 2018-05-19 NOTE — Therapy (Signed)
[facility] [place], [place], [address] Phone: [phone]   Fax:  [phone]  Physical Therapy Treatment  Patient Details  Name: Tracie Noble MRN: [id number] Date of Birth: [birth year] Referring Provider (PT): Edmonia Lynch, MD   Encounter Date: 05/19/2018  PT End of Session - 05/19/18 1445    Visit Number  16    Number of Visits  18    Date for PT Re-Evaluation  05/28/18    Authorization Type  Progress note every 10th visit    PT Start Time  1430    PT Stop Time  1529    PT Time Calculation (min)  59 min    Activity Tolerance  Patient tolerated treatment well    Behavior During Therapy  [facility] for tasks assessed/performed       Past Medical History:  Diagnosis Date  . Cancer ([place])    cervical at age 25     Past Surgical History:  Procedure Laterality Date  . ABDOMINAL HYSTERECTOMY    . JOINT REPLACEMENT     left- leg    There were no vitals filed for this visit.  Subjective Assessment - 05/19/18 1442    Subjective  Patient reported she did well at work today she was able to walk and negotiate steps with no reports of pain.    Limitations  Walking;Standing;Sitting    Diagnostic tests  x-ray: medial compartment arthritis    Patient Stated Goals  decrease pain, walk normally    Currently in Pain?  No/denies         [facility] PT Assessment - 05/19/18 0001      Assessment   Medical Diagnosis  partial knee replacement    Referring Provider (PT)  Edmonia Lynch, MD    Onset Date/Surgical Date  04/03/18    Next MD Visit  Jun 11, 2018      AROM   Right Knee Flexion  118      PROM   Right Knee Flexion  122                   [facility] Adult PT Treatment/Exercise - 05/19/18 0001      Exercises   Exercises  Knee/Hip      Knee/Hip Exercises: Stretches   Quad Stretch  Right;3 reps;20 seconds    Quad Stretch Limitations  prone      Knee/Hip Exercises: Aerobic   Stationary Bike  x15 minutes; seat  seat 8 to 3      Knee/Hip Exercises: Machines for Strengthening   Cybex Knee Extension  20# 3 mins    Cybex Knee Flexion  30# 3 mins      Vasopneumatic   Number Minutes Vasopneumatic   15 minutes    Vasopnuematic Location   Knee    Vasopneumatic Pressure  Low      Manual Therapy   Passive ROM  PROM into flexion to improve ROM; oscillations provided to decrease muscle guarding               PT Short Term Goals - 04/15/18 1535      PT SHORT TERM GOAL #1   Title  Patient will be independent with HEP    Time  3    Period  Weeks    Status  Achieved      PT SHORT TERM GOAL #2   Title  Patient will demonstrate 5 degrees or less of right knee extension AROM to improve gait  mechanics    Time  3    Period  Weeks    Status  Achieved      PT SHORT TERM GOAL #3   Title  Patient will demonstrate 90+ degrees of right knee flexion AROM to improve ability to perform functional activities.    Time  3    Period  Weeks    Status  Achieved        PT Long Term Goals - 05/05/18 1602      PT LONG TERM GOAL #1   Title  Patient will be independent with advanced HEP    Time  6    Period  Weeks    Status  On-going      PT LONG TERM GOAL #2   Title  Patient will demonstrate 0 degrees of right knee extension AROM to improve gait mechanics.    Time  6    Period  Weeks    Status  On-going      PT LONG TERM GOAL #3   Title  Patient will demonstrate 115+ degrees of right knee flexion AROM to improve ability to perform funtional activities.    Period  Weeks    Status  On-going      PT LONG TERM GOAL #4   Title  Patient will demonstrate 4+/5 or greater right knee MMT to improve stability during functional tasks.    Time  6    Period  Weeks    Status  On-going      PT LONG TERM GOAL #5   Title  Patient will report ability to ambulate community distances with no AD and less than 3/10 right knee pain to perform work activities.    Time  6    Period  Weeks    Status  On-going             Plan - 05/19/18 1518    Clinical Impression Statement  Patient was able to tolerate progression of treatment well with no reports of increased pain. Patient noted with improved right knee flexion AROM and PROM; see objective measures. Patient provided HEP for quad stretch. Patient reported understanding. Normal response to modalities upon removal.     Clinical Presentation  Stable    Clinical Decision Making  Low    Rehab Potential  Excellent    PT Frequency  3x / week    PT Duration  6 weeks    PT Treatment/Interventions  ADLs/Self Care Home Management;Gait training;Stair training;Moist Heat;Cryotherapy;Lobbyist;Therapeutic exercise;Therapeutic activities;Functional mobility training;Neuromuscular re-education;Passive range of motion;Manual techniques;Patient/family education;Taping;Vasopneumatic Device    PT Next Visit Plan  emphasis on flexion exercises. bike; nustep, knee AROM, PROM modalities PRN for pain relief    PT Home Exercise Plan  see patient education section    Consulted and Agree with Plan of Care  Patient       Patient will benefit from skilled therapeutic intervention in order to improve the following deficits and impairments:  Abnormal gait, Pain, Decreased activity tolerance, Decreased endurance, Decreased range of motion, Decreased strength, Increased edema, Difficulty walking, Improper body mechanics  Visit Diagnosis: Stiffness of right knee, not elsewhere classified  Localized edema  Difficulty in walking, not elsewhere classified     Problem List Patient Active Problem List   Diagnosis Date Noted  . Primary osteoarthritis of right knee 12/20/2017    Gabriela Eves, PT, DPT 05/19/2018, 3:56 PM  [facility] [place], [place], [address] Phone:  [phone]   Fax:  [phone]  Name: Tracie Noble MRN: [id number] Date of Birth: 1955/07/23

## 2018-05-20 ENCOUNTER — Ambulatory Visit: Payer: Managed Care, Other (non HMO) | Admitting: Physical Therapy

## 2018-05-21 ENCOUNTER — Ambulatory Visit: Payer: Managed Care, Other (non HMO) | Admitting: Physical Therapy

## 2018-05-21 ENCOUNTER — Encounter: Payer: Self-pay | Admitting: Physical Therapy

## 2018-05-21 DIAGNOSIS — R600 Localized edema: Secondary | ICD-10-CM

## 2018-05-21 DIAGNOSIS — M25661 Stiffness of right knee, not elsewhere classified: Secondary | ICD-10-CM

## 2018-05-21 DIAGNOSIS — R262 Difficulty in walking, not elsewhere classified: Secondary | ICD-10-CM

## 2018-05-21 NOTE — Therapy (Signed)
[facility] [place], [place], [address] Phone: [phone]   Fax:  [phone]  Physical Therapy Treatment  Patient Details  Name: Tracie Noble MRN: [id number] Date of Birth: [birth year] Referring Provider (PT): Edmonia Lynch, MD   Encounter Date: 05/21/2018  PT End of Session - 05/21/18 1033    Visit Number  17    Number of Visits  18    Date for PT Re-Evaluation  05/28/18    Authorization Type  Progress note every 10th visit    PT Start Time  1030    PT Stop Time  1136   waited for vasopneumatic device to be available   PT Time Calculation (min)  66 min    Activity Tolerance  Patient tolerated treatment well    Behavior During Therapy  [facility] for tasks assessed/performed       Past Medical History:  Diagnosis Date  . Cancer ([place])    cervical at age 62     Past Surgical History:  Procedure Laterality Date  . ABDOMINAL HYSTERECTOMY    . JOINT REPLACEMENT     left- leg    There were no vitals filed for this visit.  Subjective Assessment - 05/21/18 1248    Subjective  Patient reported feeling sore at work yesterday and required 2 sitting rest breaks.     Limitations  Walking;Standing;Sitting    Diagnostic tests  x-ray: medial compartment arthritis    Patient Stated Goals  decrease pain, walk normally    Currently in Pain?  No/denies   no pain, just tightness        [facility] PT Assessment - 05/21/18 0001      Assessment   Medical Diagnosis  partial knee replacement    Referring Provider (PT)  Edmonia Lynch, MD    Onset Date/Surgical Date  04/03/18    Next MD Visit  Jun 11, 2018                   [facility] Adult PT Treatment/Exercise - 05/21/18 0001      Exercises   Exercises  Knee/Hip      Knee/Hip Exercises: Stretches   Quad Stretch  Right;3 reps;30 seconds    Quad Stretch Limitations  prone      Knee/Hip Exercises: Aerobic   Stationary Bike  x15 minutes; seat seat 6 to 3       Knee/Hip Exercises: Machines for Strengthening   Cybex Knee Extension  10# two up,eccentric control single leg 3 mins    Cybex Knee Flexion  20# two up, eccentric control down 3 mins    Cybex Leg Press  1.5 plates, eccentric control 3 mins      Knee/Hip Exercises: Standing   Step Down  Right;3 sets;10 reps;Hand Hold: 2;Step Height: 6"    Other Standing Knee Exercises  lateral band walking down carpeted hall way red theraband x1      Vasopneumatic   Number Minutes Vasopneumatic   15 minutes    Vasopnuematic Location   Knee    Vasopneumatic Pressure  Low    Vasopneumatic Temperature   36               PT Short Term Goals - 04/15/18 1535      PT SHORT TERM GOAL #1   Title  Patient will be independent with HEP    Time  3    Period  Weeks    Status  Achieved  PT SHORT TERM GOAL #2   Title  Patient will demonstrate 5 degrees or less of right knee extension AROM to improve gait mechanics    Time  3    Period  Weeks    Status  Achieved      PT SHORT TERM GOAL #3   Title  Patient will demonstrate 90+ degrees of right knee flexion AROM to improve ability to perform functional activities.    Time  3    Period  Weeks    Status  Achieved        PT Long Term Goals - 05/05/18 1602      PT LONG TERM GOAL #1   Title  Patient will be independent with advanced HEP    Time  6    Period  Weeks    Status  On-going      PT LONG TERM GOAL #2   Title  Patient will demonstrate 0 degrees of right knee extension AROM to improve gait mechanics.    Time  6    Period  Weeks    Status  On-going      PT LONG TERM GOAL #3   Title  Patient will demonstrate 115+ degrees of right knee flexion AROM to improve ability to perform funtional activities.    Period  Weeks    Status  On-going      PT LONG TERM GOAL #4   Title  Patient will demonstrate 4+/5 or greater right knee MMT to improve stability during functional tasks.    Time  6    Period  Weeks    Status  On-going      PT  LONG TERM GOAL #5   Title  Patient will report ability to ambulate community distances with no AD and less than 3/10 right knee pain to perform work activities.    Time  6    Period  Weeks    Status  On-going            Plan - 05/21/18 1246    Clinical Impression Statement  Patient was able to tolerate progression of treatment well with minimal reports of increased pain, just muscle fatigue. Patient was able to demonstrate good form with new exercises and with the eccentric control of the exercises. Patient instructed to continue with flexion based exercises at home to improve ROM. Patient reported agreement and understanding.     Clinical Presentation  Stable    Clinical Decision Making  Low    Rehab Potential  Excellent    PT Frequency  3x / week    PT Duration  6 weeks    PT Treatment/Interventions  ADLs/Self Care Home Management;Gait training;Stair training;Moist Heat;Cryotherapy;Lobbyist;Therapeutic exercise;Therapeutic activities;Functional mobility training;Neuromuscular re-education;Passive range of motion;Manual techniques;Patient/family education;Taping;Vasopneumatic Device    PT Next Visit Plan  emphasis on flexion exercises. bike; nustep, knee AROM, PROM modalities PRN for pain relief    Consulted and Agree with Plan of Care  Patient       Patient will benefit from skilled therapeutic intervention in order to improve the following deficits and impairments:  Abnormal gait, Pain, Decreased activity tolerance, Decreased endurance, Decreased range of motion, Decreased strength, Increased edema, Difficulty walking, Improper body mechanics  Visit Diagnosis: Stiffness of right knee, not elsewhere classified  Localized edema  Difficulty in walking, not elsewhere classified     Problem List Patient Active Problem List   Diagnosis Date Noted  . Primary osteoarthritis of right knee 12/20/2017  Gabriela Eves, PT, DPT 05/21/2018, 12:54  PM  [facility] [address] [place], [place], [address] Phone: [phone]   Fax:  [phone]  Name: Tracie Noble MRN: [id number] Date of Birth: 1956-04-19

## 2018-05-26 ENCOUNTER — Ambulatory Visit: Payer: Managed Care, Other (non HMO) | Admitting: Physical Therapy

## 2018-05-26 DIAGNOSIS — M25661 Stiffness of right knee, not elsewhere classified: Secondary | ICD-10-CM | POA: Diagnosis not present

## 2018-05-26 DIAGNOSIS — R600 Localized edema: Secondary | ICD-10-CM

## 2018-05-26 DIAGNOSIS — R262 Difficulty in walking, not elsewhere classified: Secondary | ICD-10-CM

## 2018-05-26 NOTE — Therapy (Signed)
[facility] [place], [place], [address] Phone: [phone]   Fax:  [phone]  Physical Therapy Treatment/Discharge  Patient Details  Name: TREASA BRADSHAW MRN: [id number] Date of Birth: March 22, 1956 Referring Provider (PT): Edmonia Lynch, MD   Encounter Date: 05/26/2018  PT End of Session - 05/26/18 1406    Visit Number  18    Number of Visits  18    Date for PT Re-Evaluation  05/28/18    Authorization Type  Progress note every 10th visit    PT Start Time  1300    PT Stop Time  1403    PT Time Calculation (min)  63 min    Activity Tolerance  Patient tolerated treatment well    Behavior During Therapy  [facility] for tasks assessed/performed       Past Medical History:  Diagnosis Date  . Cancer ([place])    cervical at age 62     Past Surgical History:  Procedure Laterality Date  . ABDOMINAL HYSTERECTOMY    . JOINT REPLACEMENT     left- leg    There were no vitals filed for this visit.  Subjective Assessment - 05/26/18 1420    Subjective  Reports feeling good and ready for discharge    Limitations  Walking;Standing;Sitting    Diagnostic tests  x-ray: medial compartment arthritis    Currently in Pain?  No/denies         [facility] PT Assessment - 05/26/18 0001      Assessment   Medical Diagnosis  partial knee replacement    Referring Provider (PT)  Edmonia Lynch, MD    Onset Date/Surgical Date  04/03/18    Next MD Visit  Jun 11, 2018      AROM   Right Knee Extension  3    Right Knee Flexion  120      Strength   Right Knee Flexion  5/5    Right Knee Extension  5/5                   [facility] Adult PT Treatment/Exercise - 05/26/18 0001      Exercises   Exercises  Knee/Hip      Knee/Hip Exercises: Aerobic   Stationary Bike  Level 3x15 minutes; seat seat 6 to 2      Knee/Hip Exercises: Machines for Strengthening   Cybex Knee Extension  20# x3 minutes    Cybex Knee Flexion  40# x3 minutes    Cybex  Leg Press  1.5 plates, eccentric control 3 mins      Knee/Hip Exercises: Standing   Lunge Walking - Round Trips  down carpeted hallway x1    Other Standing Knee Exercises  lateral band walking down carpeted hall way red theraband x1      Vasopneumatic   Number Minutes Vasopneumatic   15 minutes    Vasopnuematic Location   Knee    Vasopneumatic Pressure  Low    Vasopneumatic Temperature   36               PT Short Term Goals - 04/15/18 1535      PT SHORT TERM GOAL #1   Title  Patient will be independent with HEP    Time  3    Period  Weeks    Status  Achieved      PT SHORT TERM GOAL #2   Title  Patient will demonstrate 5 degrees or less of right knee extension AROM  to improve gait mechanics    Time  3    Period  Weeks    Status  Achieved      PT SHORT TERM GOAL #3   Title  Patient will demonstrate 90+ degrees of right knee flexion AROM to improve ability to perform functional activities.    Time  3    Period  Weeks    Status  Achieved        PT Long Term Goals - 05/26/18 1404      PT LONG TERM GOAL #1   Title  Patient will be independent with advanced HEP    Time  6    Period  Weeks    Status  Achieved      PT LONG TERM GOAL #2   Title  Patient will demonstrate 0 degrees of right knee extension AROM to improve gait mechanics.    Time  6    Period  Weeks    Status  Partially Met   3 degress     PT LONG TERM GOAL #3   Title  Patient will demonstrate 115+ degrees of right knee flexion AROM to improve ability to perform funtional activities.    Time  6    Period  Weeks    Status  Achieved   120     PT LONG TERM GOAL #4   Title  Patient will demonstrate 4+/5 or greater right knee MMT to improve stability during functional tasks.    Time  6    Period  Weeks    Status  Achieved      PT LONG TERM GOAL #5   Title  Patient will report ability to ambulate community distances with no AD and less than 3/10 right knee pain to perform work activities.     Period  Weeks    Status  Achieved            Plan - 05/26/18 1407    Clinical Impression Statement  Patient was able to complete treatment with no reports of pain. Patient was inquiring about treadmill walking and asked it was okay to perform. Patient instructed to try it and see how she feels and progress as she feels ready. Patient reported understanding. Patient's goals have been partially met; knee extension was not met. DC at this time.    Clinical Presentation  Stable    Clinical Decision Making  Low    Rehab Potential  Excellent    PT Frequency  3x / week    PT Duration  6 weeks    PT Treatment/Interventions  ADLs/Self Care Home Management;Gait training;Stair training;Moist Heat;Cryotherapy;Lobbyist;Therapeutic exercise;Therapeutic activities;Functional mobility training;Neuromuscular re-education;Passive range of motion;Manual techniques;Patient/family education;Taping;Vasopneumatic Device    PT Next Visit Plan  DC;    Consulted and Agree with Plan of Care  Patient       Patient will benefit from skilled therapeutic intervention in order to improve the following deficits and impairments:  Abnormal gait, Pain, Decreased activity tolerance, Decreased endurance, Decreased range of motion, Decreased strength, Increased edema, Difficulty walking  Visit Diagnosis: Stiffness of right knee, not elsewhere classified  Localized edema  Difficulty in walking, not elsewhere classified     Problem List Patient Active Problem List   Diagnosis Date Noted  . Primary osteoarthritis of right knee 12/20/2017    Gabriela Eves, PT, DPT 05/26/2018, 2:21 PM  [facility] [address] [place], [place], [address] Phone: [phone]   Fax:  [phone]  Name: AKIYA MORR MRN: [id number] Date of Birth: 1955/11/02  PHYSICAL THERAPY DISCHARGE SUMMARY  Visits from Start of Care: 18  Current functional  level related to goals / functional outcomes: See above   Remaining deficits: See goals    Education / Equipment: HEP, theraband Plan: Patient agrees to discharge.  Patient goals were partially met. Patient is being discharged due to being pleased with the current functional level.  ?????

## 2018-07-15 ENCOUNTER — Ambulatory Visit: Payer: Managed Care, Other (non HMO) | Admitting: Family Medicine

## 2018-07-15 ENCOUNTER — Ambulatory Visit (INDEPENDENT_AMBULATORY_CARE_PROVIDER_SITE_OTHER): Payer: Managed Care, Other (non HMO)

## 2018-07-15 ENCOUNTER — Encounter: Payer: Self-pay | Admitting: Family Medicine

## 2018-07-15 VITALS — BP 126/81 | HR 66 | Temp 98.8°F | Ht 71.0 in | Wt 155.0 lb

## 2018-07-15 DIAGNOSIS — M79672 Pain in left foot: Secondary | ICD-10-CM

## 2018-07-15 MED ORDER — CELECOXIB 200 MG PO CAPS: 200.0000 mg | ORAL_CAPSULE | Freq: Every day | ORAL | 1 refills | Status: DC

## 2018-07-15 NOTE — Progress Notes (Signed)
Chief Complaint  Patient presents with  . Foot Pain    left, 2 weeks    HPI  Patient presents today for swelling in the left foot for 2 weeks.  It is sore at the lateral midfoot as well.  She works on concrete all day full-time.  She has no known injury.  PMH: Smoking status noted ROS: Per HPI  Objective: BP 126/81   Pulse 66   Temp 98.8 F (37.1 C) (Oral)   Ht 5\' 11"  (1.803 m)   Wt 155 lb (70.3 kg)   BMI 21.62 kg/m  Gen: NAD, alert, cooperative with exam HEENT: NCAT, EOMI, PERRL Ext: 2+ edema of the foot and distal third of the leg on the left.  Pulses intact.  Full range of motion.  There is a 1 x 3 cm blister over the lateral aspect of the foot. Neuro: Alert and oriented, No gross deficits XR - calcified tendon near site of soreness  Assessment and plan:  1. Left foot pain     Meds ordered this encounter  Medications  . celecoxib (CELEBREX) 200 MG capsule    Sig: Take 1 capsule (200 mg total) by mouth daily.    Dispense:  30 capsule    Refill:  1    Orders Placed This Encounter  Procedures  . DG Foot Complete Left    Standing Status:   Future    Number of Occurrences:   1    Standing Expiration Date:   09/14/2019    Order Specific Question:   Reason for Exam (SYMPTOM  OR DIAGNOSIS REQUIRED)    Answer:   foot pain x 2 weeks    Order Specific Question:   Is the patient pregnant?    Answer:   No    Order Specific Question:   Preferred imaging location?    Answer:   Internal   Boot fitted. Follow up as needed.  Claretta Fraise, MD

## 2018-07-16 ENCOUNTER — Ambulatory Visit: Payer: Managed Care, Other (non HMO) | Admitting: Family Medicine

## 2018-08-08 ENCOUNTER — Ambulatory Visit (INDEPENDENT_AMBULATORY_CARE_PROVIDER_SITE_OTHER): Payer: Managed Care, Other (non HMO) | Admitting: Family Medicine

## 2018-08-08 ENCOUNTER — Encounter: Payer: Self-pay | Admitting: Family Medicine

## 2018-08-08 VITALS — BP 128/74 | HR 85 | Temp 97.8°F | Ht 71.0 in | Wt 162.8 lb

## 2018-08-08 DIAGNOSIS — M79672 Pain in left foot: Secondary | ICD-10-CM | POA: Diagnosis not present

## 2018-08-08 DIAGNOSIS — R2242 Localized swelling, mass and lump, left lower limb: Secondary | ICD-10-CM

## 2018-08-08 MED ORDER — METHYLPREDNISOLONE ACETATE 40 MG/ML IJ SUSP
40.0000 mg | Freq: Once | INTRAMUSCULAR | Status: AC
  Administered 2018-08-08: 40 mg via INTRAMUSCULAR

## 2018-08-08 NOTE — Progress Notes (Signed)
Subjective: CC: Left foot pain PCP: Janora Norlander, DO [id number] Tracie Noble is a 63 y.o. female presenting to clinic today for:  1.  Left foot pain Patient reports ongoing left-sided foot pain with associated swelling that has been ongoing for about 2 months now.  She was seen on 07/15/2018 for foot pain and had x-rays which showed mild degenerative changes within the midfoot and a small calcaneal spur.  No overt fractures or abnormalities noted to explain pain.  She notes that she was sent home in a boot but she was unable to tolerate this secondary to increased pain.  Pain is primarily along the forefoot.  She voices concern over associated swelling but again this is been ongoing and localized to ankle and below only.  No long-term immobilization, treatment for cancer or recent travel.  Area is not erythematous or hot.  She had had some concern about a DVT because this was briefly mentioned by a previous provider but has not been evaluated further.  No chest pain or shortness of breath.  Celebrex was ineffective for treatment of the foot pain.   ROS: Per HPI  No Known Allergies Past Medical History:  Diagnosis Date  . 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  Acetaminophen (TYLENOL PO), Take by mouth., Disp: , Rfl:  .  aspirin 81 MG tablet, Take 81 mg by mouth daily., Disp: , Rfl:  Social History   Socioeconomic History  . Marital status: Married    Spouse name: Not on file  . Number of children: Not on file  . Years of education: Not on file  . Highest education level: Not on file  Occupational History  . Not on file  Social Needs  . Financial resource strain: Not on file  . Food insecurity:    Worry: Not on file    Inability: Not on file  . Transportation needs:    Medical: Not on file    Non-medical: Not on file  Tobacco Use  . Smoking status: Former Smoker    Packs/day: 0.25    Years: 10.00    Pack years: 2.50    Last attempt to  quit: 09/06/2002    Years since quitting: 15.9  . Smokeless tobacco: Never Used  Substance and Sexual Activity  . Alcohol use: No  . Drug use: No  . Sexual activity: Yes    Comment: married 7 years, on daughter and grankids  Lifestyle  . Physical activity:    Days per week: Not on file    Minutes per session: Not on file  . Stress: Not on file  Relationships  . Social connections:    Talks on phone: Not on file    Gets together: Not on file    Attends religious service: Not on file    Active member of club or organization: Not on file    Attends meetings of clubs or organizations: Not on file    Relationship status: Not on file  . Intimate partner violence:    Fear of current or ex partner: Not on file    Emotionally abused: Not on file    Physically abused: Not on file    Forced sexual activity: Not on file  Other Topics Concern  . Not on file  Social History Narrative  . Not on file   Family History  Problem Relation Age of Onset  . Dementia Mother   .  Cancer Father        lymph nodes, non hodgkins lymphoma  . Diabetes Father     Objective: Office vital signs reviewed. BP 128/74   Pulse 85   Temp 97.8 F (36.6 C) (Oral)   Ht 5\' 11"  (1.803 m)   Wt 162 lb 12.8 oz (73.8 kg)   BMI 22.71 kg/m   Physical Examination:  General: Awake, alert, well nourished, No acute distress MSK:  Left foot: 1+ pitting edema to ankle. No increased warmth or erythema.  She has TTP to the forefoot dorsally.  No palpable bony abnormalities. AROM mostly in tact but somewhat limited by swelling Neuro: light touch sensation in tact.  Assessment/ Plan: 63 y.o. female   1. Localized swelling of left lower extremity Wells score for DVT 1 for swelling localized to LLE.  We will proceed with d-dimer to rule out DVT.  My level of suspicion is not especially high but cannot rule out with positive Wells score.  I will contact patient with further instruction once result has returned. -  D-dimer, quantitative (not at [facility])  2. Left foot pain Swelling is likely related to soft tissue injury.  I reviewed her x-ray and previous office visit.  Nothing bony appears to be causing her symptoms.  I have placed a referral to podiatry for further evaluation and management.  Would be interested to know what her soft structures look like under ultrasound.  In the meantime, I have placed her in an Ace wrap to help with swelling.  I encouraged her to elevate lower extremities.  Okay to use ice if needed for pain.  She was also given a dose of Depo-Medrol here in office to help with pain and swelling.  We discussed reasons for emergent evaluation.  She voiced good understanding will follow-up PRN - Ambulatory referral to Podiatry - methylPREDNISolone acetate (DEPO-MEDROL) injection 40 mg   Orders Placed This Encounter  Procedures  . D-dimer, quantitative (not at [facility])  . Ambulatory referral to Podiatry    Referral Priority:   Routine    Referral Type:   Consultation    Referral Reason:   Specialty Services Required    Requested Specialty:   Podiatry    Number of Visits Requested:   1   Meds ordered this encounter  Medications  . methylPREDNISolone acetate (DEPO-MEDROL) injection 40 mg     Janora Norlander, DO [place] [phone]

## 2018-08-08 NOTE — Patient Instructions (Signed)
Elevate foot You had labs performed today.  You will be contacted with the results of the labs once they are available, usually in the next 3 business days for routine lab work.   I put a referral into the foot/ankle doctor

## 2018-08-12 ENCOUNTER — Ambulatory Visit (HOSPITAL_COMMUNITY)
Admission: RE | Admit: 2018-08-12 | Discharge: 2018-08-12 | Disposition: A | Discharge status: Home / Self Care | Payer: Managed Care, Other (non HMO) | Source: Ambulatory Visit | Attending: Family Medicine | Admitting: Family Medicine

## 2018-08-12 ENCOUNTER — Other Ambulatory Visit: Payer: Self-pay | Admitting: Family Medicine

## 2018-08-12 DIAGNOSIS — R2242 Localized swelling, mass and lump, left lower limb: Secondary | ICD-10-CM

## 2018-08-12 DIAGNOSIS — R7989 Other specified abnormal findings of blood chemistry: Secondary | ICD-10-CM

## 2018-08-12 LAB — D-DIMER, QUANTITATIVE: D-DIMER: 0.75 mg/L FEU — ABNORMAL HIGH (ref 0.00–0.49)

## 2018-08-15 ENCOUNTER — Encounter: Payer: Self-pay | Admitting: Podiatry

## 2018-08-15 ENCOUNTER — Ambulatory Visit: Payer: Managed Care, Other (non HMO)

## 2018-08-15 ENCOUNTER — Other Ambulatory Visit: Payer: Self-pay

## 2018-08-15 ENCOUNTER — Ambulatory Visit: Payer: Managed Care, Other (non HMO) | Admitting: Podiatry

## 2018-08-15 VITALS — BP 125/78 | HR 65 | Resp 16

## 2018-08-15 DIAGNOSIS — M779 Enthesopathy, unspecified: Secondary | ICD-10-CM | POA: Diagnosis not present

## 2018-08-15 DIAGNOSIS — R600 Localized edema: Secondary | ICD-10-CM | POA: Diagnosis not present

## 2018-08-15 DIAGNOSIS — M7750 Other enthesopathy of unspecified foot: Secondary | ICD-10-CM

## 2018-08-15 DIAGNOSIS — M778 Other enthesopathies, not elsewhere classified: Secondary | ICD-10-CM

## 2018-09-17 NOTE — Progress Notes (Signed)
  Subjective:  Patient ID: Tracie Noble, female    DOB: [birth year],  MRN: [id number]  Chief Complaint  Patient presents with  . Foot Pain    Dorsal midfoot and ankle left - swelling x 2 months, no pain, goes down at night, had knee replacement on right, ultrasound for DVT (neg) and xrays taken (neg), taking Celebrex  . New Patient (Initial Visit)    63 y.o. female presents with the above complaint.   Review of Systems: Negative except as noted in the HPI. Denies N/V/F/Ch.  Past Medical History:  Diagnosis Date  . Cancer ([place])    cervical at age 89     Current Outpatient Medications:  .  celecoxib (CELEBREX) 200 MG capsule, Take 200 mg by mouth 2 (two) times daily., Disp: , Rfl:   Social History   Tobacco Use  Smoking Status Former Smoker  . Packs/day: 0.25  . Years: 10.00  . Pack years: 2.50  . Last attempt to quit: 09/06/2002  . Years since quitting: 16.0  Smokeless Tobacco Never Used    No Known Allergies Objective:   Vitals:   08/15/18 0943  BP: 125/78  Pulse: 65  Resp: 16   There is no height or weight on file to calculate BMI. Constitutional Well developed. Well nourished.  Vascular Dorsalis pedis pulses palpable bilaterally. Posterior tibial pulses palpable bilaterally. Capillary refill normal to all digits.  No cyanosis or clubbing noted. Pedal hair growth normal.  Neurologic Normal speech. Oriented to person, place, and time. Epicritic sensation to light touch grossly present bilaterally.  Dermatologic Nails well groomed and normal in appearance. No open wounds. No skin lesions.  Orthopedic: POP left dorsal midfoot. Left ankle edema, varicosities.   Radiographs: Taken and reviewed no acute fractures or dislocations. Midfoot degenerative changes. Assessment:   1. Tendonitis of ankle or foot   2. Capsulitis of left foot   3. Localized edema    Plan:  Patient was evaluated and treated and all questions answered.  Left ankle edema -Dicsused  etiologies of edema -Tubigrip dispensed  Dorsal midfoot arthritis -Educated on etiology.  Return in about 6 weeks (around 09/26/2018) for Swelling f/u, capsulitis f/u .

## 2018-09-26 ENCOUNTER — Encounter: Payer: Self-pay | Admitting: Podiatry

## 2018-09-26 ENCOUNTER — Ambulatory Visit: Payer: Managed Care, Other (non HMO) | Admitting: Podiatry

## 2018-09-26 ENCOUNTER — Other Ambulatory Visit: Payer: Self-pay

## 2018-09-26 VITALS — Temp 98.1°F

## 2018-09-26 DIAGNOSIS — M7750 Other enthesopathy of unspecified foot: Secondary | ICD-10-CM | POA: Diagnosis not present

## 2018-09-26 DIAGNOSIS — M779 Enthesopathy, unspecified: Secondary | ICD-10-CM | POA: Diagnosis not present

## 2018-09-26 DIAGNOSIS — M778 Other enthesopathies, not elsewhere classified: Secondary | ICD-10-CM

## 2018-09-30 ENCOUNTER — Telehealth: Payer: Self-pay | Admitting: *Deleted

## 2018-09-30 NOTE — Telephone Encounter (Signed)
I spoke with [employer] associate and she states due to the COVID-19 they are not currently performing measurements, but the knee high compression sock can be measured by pt at the smallest portion of the ankle and the largest portion of the calf and can be processed by their associates and delivered curbside to pt.

## 2018-09-30 NOTE — Telephone Encounter (Signed)
I informed pt of the information I had received from [facility] and she stated understanding and thanked me.

## 2018-09-30 NOTE — Telephone Encounter (Signed)
Pt called states [employer] due to the COVID-19 virus is not performing measurements for compression hose.

## 2018-09-30 NOTE — Telephone Encounter (Signed)
Unable to speak with sales associate with [place].

## 2018-12-02 NOTE — Progress Notes (Signed)
  Subjective:  Patient ID: Tracie Noble, female    DOB: March 26, 1956,  MRN: [id number]  Chief Complaint  Patient presents with  . Ankle Pain    Follow up tendonitis/ankle swelling left   "I can't believe by the very next day it was so much better. I've even been on the treadmill"    63 y.o. female presents with the above complaint. Hx as above.  Review of Systems: Negative except as noted in the HPI. Denies N/V/F/Ch.  Past Medical History:  Diagnosis Date  . Cancer ([place])    cervical at age 56     Current Outpatient Medications:  .  celecoxib (CELEBREX) 200 MG capsule, Take 200 mg by mouth 2 (two) times daily., Disp: , Rfl:   Social History   Tobacco Use  Smoking Status Former Smoker  . Packs/day: 0.25  . Years: 10.00  . Pack years: 2.50  . Quit date: 09/06/2002  . Years since quitting: 16.2  Smokeless Tobacco Never Used    No Known Allergies Objective:   Vitals:   09/26/18 1221  Temp: 98.1 F (36.7 C)   There is no height or weight on file to calculate BMI. Constitutional Well developed. Well nourished.  Vascular Dorsalis pedis pulses palpable bilaterally. Posterior tibial pulses palpable bilaterally. Capillary refill normal to all digits.  No cyanosis or clubbing noted. Pedal hair growth normal.  Neurologic Normal speech. Oriented to person, place, and time. Epicritic sensation to light touch grossly present bilaterally.  Dermatologic Nails well groomed and normal in appearance. No open wounds. No skin lesions.  Orthopedic: No left dorsal midfoot. Left ankle edema, varicosities.   Radiographs: none. Assessment:   1. Tendonitis of ankle or foot   2. Capsulitis of left foot    Plan:  Patient was evaluated and treated and all questions answered.  Dorsal midfoot arthritis -Resolved. F/u as needed should pain recur.  Return if symptoms worsen or fail to improve.

## 2019-04-13 ENCOUNTER — Other Ambulatory Visit: Payer: Self-pay

## 2019-04-13 DIAGNOSIS — Z20822 Contact with and (suspected) exposure to covid-19: Secondary | ICD-10-CM

## 2019-04-15 ENCOUNTER — Telehealth: Payer: Self-pay

## 2019-04-15 NOTE — Telephone Encounter (Signed)
Received call from patient checking Covid results.  Advised no results at this time.   

## 2019-04-16 LAB — NOVEL CORONAVIRUS, NAA: SARS-CoV-2, NAA: NOT DETECTED

## 2019-07-20 IMAGING — DX DG FOOT COMPLETE 3+V*L*
3 series · 3 of 3 positions shown · non-contrast
Comparison: None.

CLINICAL DATA: Acute LEFT foot pain and swelling.  No known injury.

EXAM:
LEFT FOOT - COMPLETE 3+ VIEW

[foot ap]
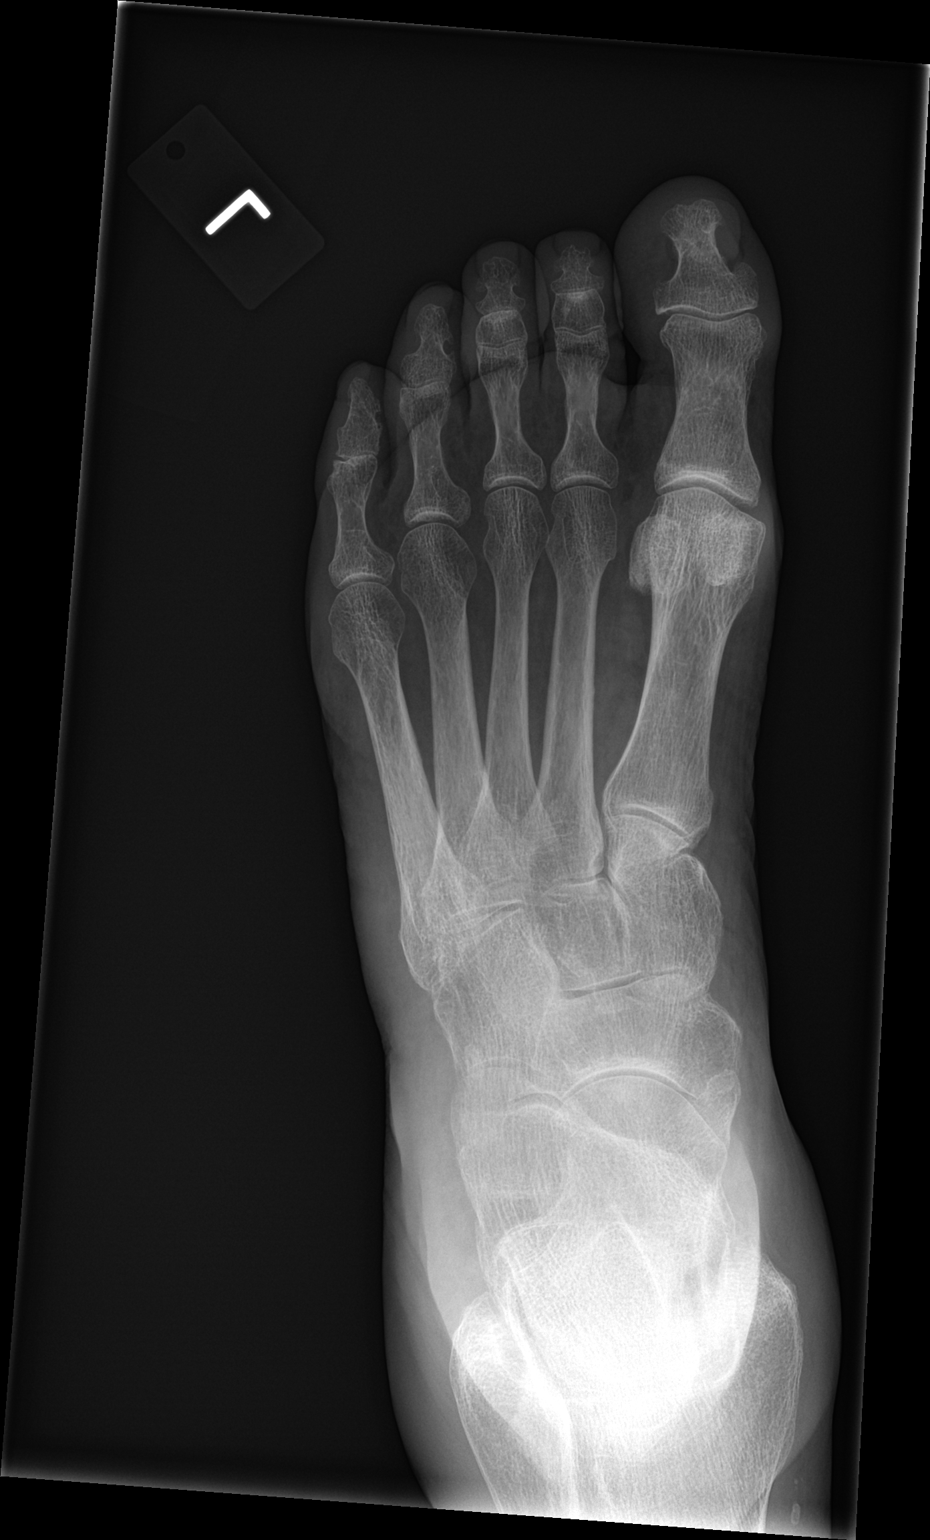

[foot obl]
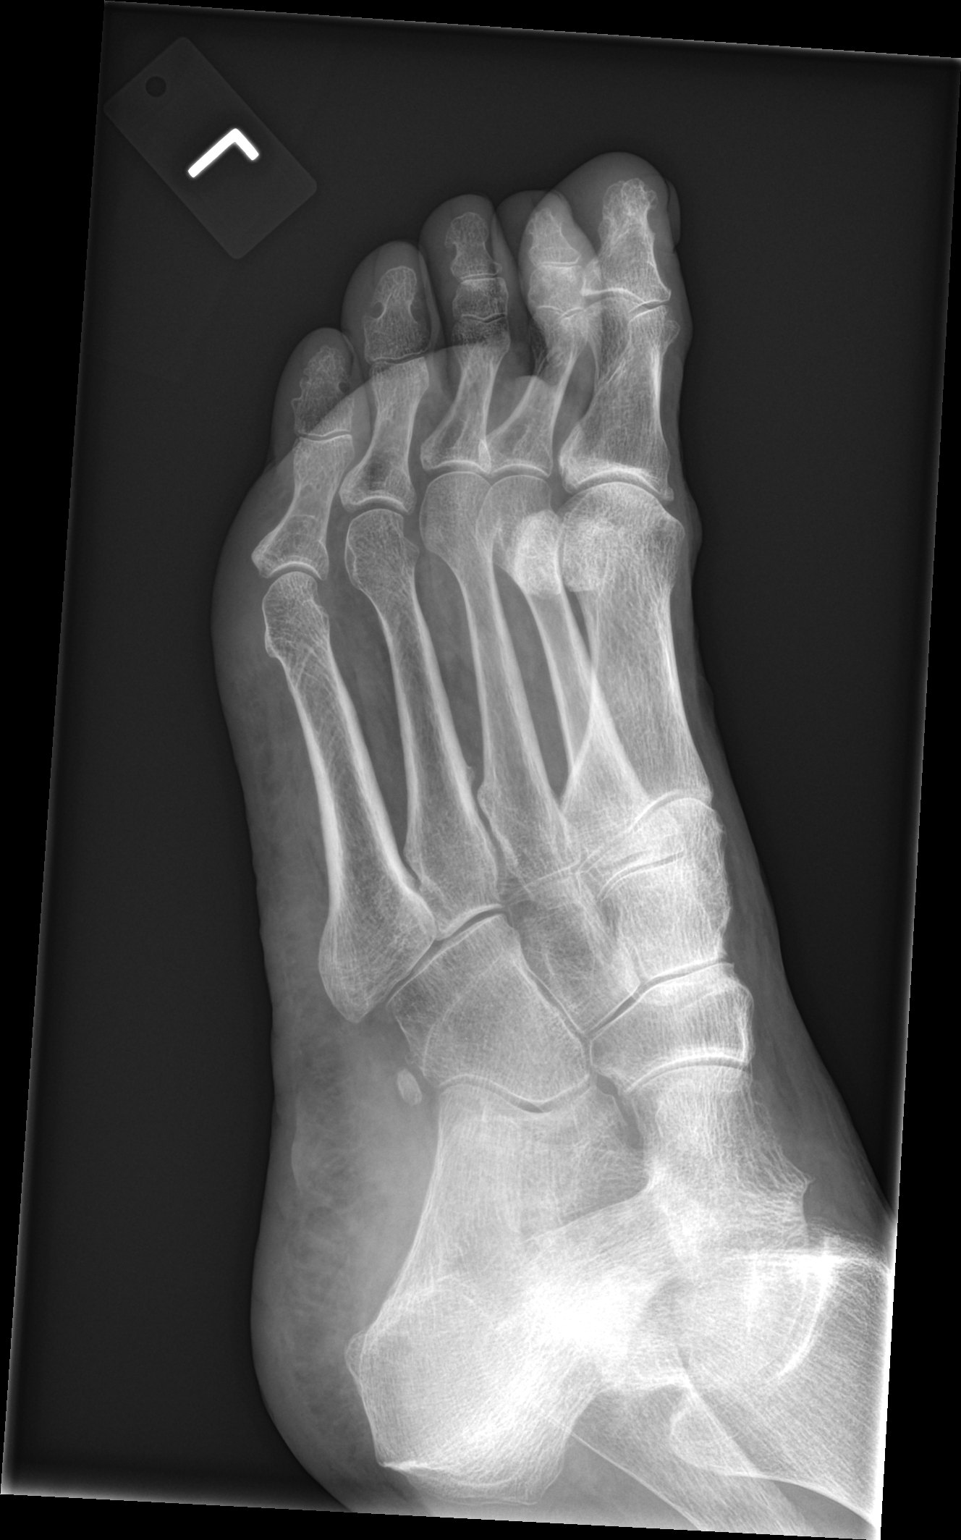

[foot lat]
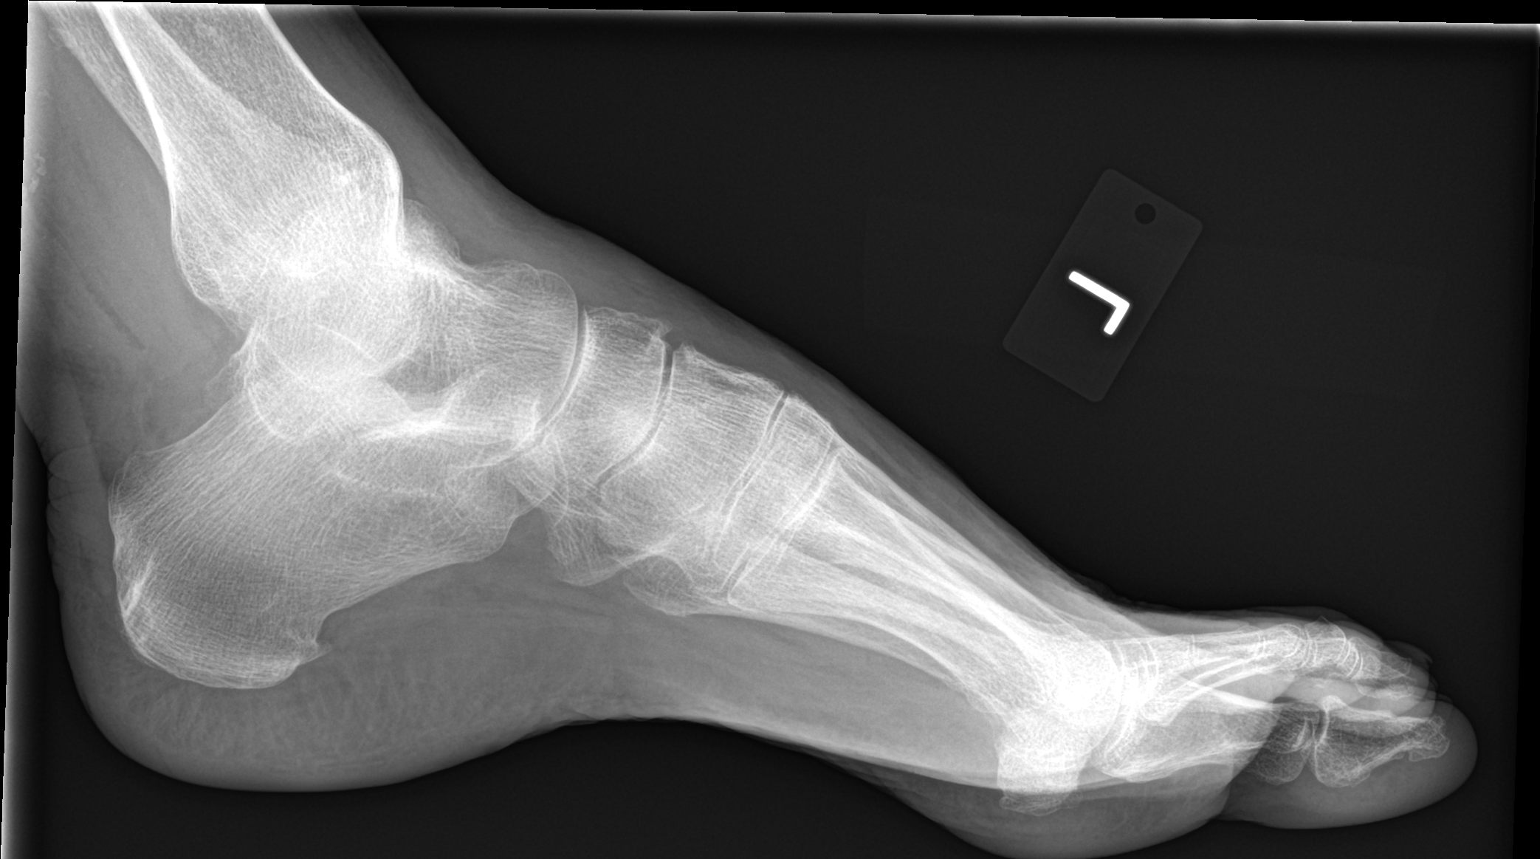

[3 of 3 positions shown; findings below may reference images not displayed]

FINDINGS: There is no evidence of acute fracture, subluxation or dislocation.

Minimal dorsal midfoot degenerative changes noted.

A small calcaneal spur is present.

No soft tissue abnormalities are identified.
IMPRESSION: No evidence of acute abnormality.

Minimal midfoot degenerative changes and small calcaneal spur.

## 2019-10-21 ENCOUNTER — Encounter: Payer: Self-pay | Admitting: Family Medicine

## 2019-10-21 ENCOUNTER — Other Ambulatory Visit: Payer: Self-pay

## 2019-10-21 ENCOUNTER — Ambulatory Visit (INDEPENDENT_AMBULATORY_CARE_PROVIDER_SITE_OTHER): Payer: Self-pay | Admitting: Family Medicine

## 2019-10-21 VITALS — BP 138/88 | HR 83 | Temp 97.9°F | Ht 71.0 in | Wt 158.0 lb

## 2019-10-21 DIAGNOSIS — M7022 Olecranon bursitis, left elbow: Secondary | ICD-10-CM

## 2019-10-21 NOTE — Patient Instructions (Addendum)
Get a compression sleeve for olecranon bursitis (recommend like a child sizes calf sleeve since there is no elbow hole).  You can get these at sports stores like [employer], etc. Ok to use Celebrex. Use ice and PT as below. If no improvement or if you develop pain, this would be a reason to drain it with a needle.  Elbow Bursitis Rehab Ask your health care provider which exercises are safe for you. Do exercises exactly as told by your health care provider and adjust them as directed. It is normal to feel mild stretching, pulling, tightness, or discomfort as you do these exercises. Stop right away if you feel sudden pain or your pain gets worse. Do not begin these exercises until told by your health care provider. Stretching and range-of-motion exercises These exercises warm up your muscles and joints and improve the movement and flexibility of your elbow. The exercises also help to relieve pain and swelling. Elbow flexion, assisted 1. Stand or sit with your left / right arm at your side. 2. Use your other hand to gently push your left / right hand toward your shoulder (assisted) while bending your elbow (flexion). 3. Hold this position for __________ seconds. 4. Slowly return your left / right arm to the starting position. Repeat __________ times. Complete this exercise __________ times a day. Elbow extension, assisted 1. Lie on your back in a comfortable position that allows you to relax your arm muscles. 2. Place a folded towel under your left / right upper arm so that your elbow and shoulder are at the same height. 3. Use your other arm to raise your left / right arm (assisted) until your elbow and hand do not rest on the bed or towel. Hold your left / right arm out straight with your other hand supporting it. 4. Let the weight of your hand straighten your elbow (extension). You should feel a stretch on the inside of your elbow. ? Keep your arm and chest muscles relaxed. ? If directed, add a  small wrist weight or hand weight to increase the stretch. 5. Hold this position for __________ seconds. 6. Slowly release the stretch and return to the starting position. Repeat __________ times. Complete this exercise __________ times a day. Elbow flexion, active 1. Stand or sit with your left / right elbow bent and your palm facing in, toward your body. 2. Bend your elbow as far as you can using only your arm muscles (active flexion). 3. Hold this position for __________ seconds. 4. Slowly return to the starting position. Repeat __________ times. Complete this exercise __________ times a day. Elbow extension, active 1. Stand or sit with your left / right elbow bent and your palm facing in, toward your body. 2. Slowly straighten your elbow using only your arm muscles (active extension). Stop when you feel a gentle stretch at the front of your arm, or when your arm is straight. 3. Hold this position for __________ seconds. 4. Slowly return to the starting position. Repeat __________ times. Complete this exercise __________ times a day. Strengthening exercises These exercises build strength and endurance in your elbow. Endurance is the ability to use your muscles for a long time, even after they get tired. Elbow flexion, isometric 1. Stand or sit with your left / right arm at waist height. Your palm should face in, toward your body. 2. Place your other hand on top of your left / right forearm. Gently push down while you resist with your left / right  arm (isometric flexion). ? Use about 50% effort with both arms. You may be instructed to use more and more effort with your arms each week. ? Try not to let your left / right arm move during the exercise. 3. Hold this position for __________ seconds. 4. Let your muscles relax completely before you repeat the exercise. Repeat __________ times. Complete this exercise __________ times a day. Elbow extension, isometric  1. Stand or sit with your  left / right arm at waist height. Your palm should face in, toward your body. 2. Place your other hand on the bottom of your left / right forearm. Gently push up while you resist with your left / right arm (isometric extension). ? Use about 50% effort with both arms. You may be instructed to use more and more effort with your arms each week. ? Try not to let your left / right arm move during the exercise. 3. Hold this position for __________ seconds. 4. Let your muscles relax completely before you repeat the exercise. Repeat __________ times. Complete this exercise __________ times a day. Biceps curls 1. Sit on a stable chair without armrests, or stand up. 2. Hold a _________ weight in your left / right hand. Your palm should face out, away from your body, at the starting position. 3. Bend your left / right elbow and move your hand up toward your shoulder. Keep your elbow at your side while you bend it. 4. Slowly return to the starting position. Repeat __________ times. Complete this exercise __________ times a day. Triceps curls  1. Lie on your back. 2. Hold a _________ weight in your left / right hand. 3. Bend your left / right elbow to a 90-degree angle (right angle), so the weight is in front of your face, over your chest, and your elbow is pointed up to the ceiling. 4. Straighten your elbow, raising your hand toward the ceiling. Use your other hand to support your left / right upper arm and to keep it still. 5. Slowly return to the starting position. Repeat __________ times. Complete this exercise __________ times a day. This information is not intended to replace advice given to you by your health care provider. Make sure you discuss any questions you have with your health care provider. Document Revised: 09/11/2018 Document Reviewed: 06/11/2018 Elsevier Patient Education  [place].

## 2019-10-21 NOTE — Progress Notes (Signed)
Subjective: CC: elbow swelling PCP: Janora Norlander, DO [id number] Tracie Noble is a 64 y.o. female presenting to clinic today for:  1. Elbow swelling Patient reports couple week history of left-sided elbow swelling.  She notes that the largeness of the swelling waxes and wanes.  Is at the tip of her posterior left elbow.  Denies any pain.  She does have frequent overhead movements with upper extremities constantly pushing pulling and moving around.  Denies any direct trauma to the left elbow.  She has not really treated the elbow with any medication, ice or compression.   ROS: Per HPI  No Known Allergies Past Medical History:  Diagnosis Date  . Cancer ([place])    cervical at age 89    No current outpatient medications on file. Social History   Socioeconomic History  . Marital status: Married    Spouse name: Not on file  . Number of children: Not on file  . Years of education: Not on file  . Highest education level: Not on file  Occupational History  . Not on file  Tobacco Use  . Smoking status: Former Smoker    Packs/day: 0.25    Years: 10.00    Pack years: 2.50    Quit date: 09/06/2002    Years since quitting: 17.1  . Smokeless tobacco: Never Used  Substance and Sexual Activity  . Alcohol use: No  . Drug use: No  . Sexual activity: Yes    Comment: married 7 years, on daughter and grankids  Other Topics Concern  . Not on file  Social History Narrative  . Not on file   Social Determinants of Health   Financial Resource Strain:   . Difficulty of Paying Living Expenses:   Food Insecurity:   . Worried About Charity fundraiser in the Last Year:   . Arboriculturist in the Last Year:   Transportation Needs:   . Film/video editor (Medical):   Marland Kitchen Lack of Transportation (Non-Medical):   Physical Activity:   . Days of Exercise per Week:   . Minutes of Exercise per Session:   Stress:   . Feeling of Stress :   Social Connections:   . Frequency of Communication with  Friends and Family:   . Frequency of Social Gatherings with Friends and Family:   . Attends Religious Services:   . Active Member of Clubs or Organizations:   . Attends Archivist Meetings:   Marland Kitchen Marital Status:   Intimate Partner Violence:   . Fear of Current or Ex-Partner:   . Emotionally Abused:   Marland Kitchen Physically Abused:   . Sexually Abused:    Family History  Problem Relation Age of Onset  . Dementia Mother   . Cancer Father        lymph nodes, non hodgkins lymphoma  . Diabetes Father     Objective: Office vital signs reviewed. BP 138/88   Pulse 83   Temp 97.9 F (36.6 C) (Temporal)   Ht 5\' 11"  (1.803 m)   Wt 158 lb (71.7 kg)   SpO2 98%   BMI 22.04 kg/m   Physical Examination:  General: Awake, alert, well nourished, No acute distress Elbow: Fluctuant, nontender, nonerythematous, nonwarm well-circumscribed lesion noted over the olecranon bursa consistent with olecranon bursitis.  She has full active range of motion of the left upper extremity.  Assessment/ Plan: 64 y.o. female   1. Olecranon bursitis of left elbow No tenderness or evidence of  infection.  We discussed conservative measures including compression, ice and use of oral NSAIDs if needed.  I have also given her home physical therapy for olecranon bursitis.  Would advise against drainage and corticosteroid injection at this time given risks of infection outweighing the benefits of drainage.  She was good understanding of the plan.  She will follow-up if needed.   No orders of the defined types were placed in this encounter.  No orders of the defined types were placed in this encounter.    Janora Norlander, DO [place] [phone]

## 2020-02-26 ENCOUNTER — Telehealth: Payer: Self-pay | Admitting: Infectious Diseases

## 2020-02-26 ENCOUNTER — Ambulatory Visit (HOSPITAL_COMMUNITY)
Admission: RE | Admit: 2020-02-26 | Discharge: 2020-02-26 | Disposition: A | Discharge status: Home / Self Care | Payer: HRSA Program | Source: Ambulatory Visit | Attending: Pulmonary Disease | Admitting: Pulmonary Disease

## 2020-02-26 ENCOUNTER — Ambulatory Visit (INDEPENDENT_AMBULATORY_CARE_PROVIDER_SITE_OTHER): Payer: Self-pay | Admitting: Family

## 2020-02-26 ENCOUNTER — Encounter: Payer: Self-pay | Admitting: Family

## 2020-02-26 ENCOUNTER — Other Ambulatory Visit: Payer: Self-pay | Admitting: Infectious Diseases

## 2020-02-26 DIAGNOSIS — U071 COVID-19: Secondary | ICD-10-CM | POA: Insufficient documentation

## 2020-02-26 DIAGNOSIS — R059 Cough, unspecified: Secondary | ICD-10-CM

## 2020-02-26 DIAGNOSIS — Z5989 Other problems related to housing and economic circumstances: Secondary | ICD-10-CM

## 2020-02-26 DIAGNOSIS — Z598 Other problems related to housing and economic circumstances: Secondary | ICD-10-CM | POA: Insufficient documentation

## 2020-02-26 DIAGNOSIS — Z20822 Contact with and (suspected) exposure to covid-19: Secondary | ICD-10-CM

## 2020-02-26 DIAGNOSIS — R05 Cough: Secondary | ICD-10-CM

## 2020-02-26 MED ORDER — DIPHENHYDRAMINE HCL 50 MG/ML IJ SOLN: 50.0000 mg | Freq: Once | INTRAMUSCULAR | Status: DC | PRN

## 2020-02-26 MED ORDER — SODIUM CHLORIDE 0.9 % IV SOLN: INTRAVENOUS | Status: DC | PRN

## 2020-02-26 MED ORDER — ALBUTEROL SULFATE HFA 108 (90 BASE) MCG/ACT IN AERS: 2.0000 | INHALATION_SPRAY | Freq: Once | RESPIRATORY_TRACT | Status: DC | PRN

## 2020-02-26 MED ORDER — EPINEPHRINE 0.3 MG/0.3ML IJ SOAJ: 0.3000 mg | Freq: Once | INTRAMUSCULAR | Status: DC | PRN

## 2020-02-26 MED ORDER — FAMOTIDINE IN NACL 20-0.9 MG/50ML-% IV SOLN: 20.0000 mg | Freq: Once | INTRAVENOUS | Status: DC | PRN

## 2020-02-26 MED ORDER — SODIUM CHLORIDE 0.9 % IV SOLN
1200.0000 mg | Freq: Once | INTRAVENOUS | Status: AC
  Administered 2020-02-26: 1200 mg via INTRAVENOUS

## 2020-02-26 MED ORDER — METHYLPREDNISOLONE SODIUM SUCC 125 MG IJ SOLR: 125.0000 mg | Freq: Once | INTRAMUSCULAR | Status: DC | PRN

## 2020-02-26 NOTE — Addendum Note (Signed)
Addended by: Tompkins Callas on: 02/26/2020 10:20 AM   Modules accepted: Orders, SmartSet

## 2020-02-26 NOTE — Progress Notes (Signed)
   Virtual Visit via telephone Note Due to COVID-19 pandemic this visit was conducted virtually. This visit type was conducted due to national recommendations for restrictions regarding the COVID-19 Pandemic (e.g. social distancing, sheltering in place) in an effort to limit this patient's exposure and mitigate transmission in our community. All issues noted in this document were discussed and addressed.  A physical exam was not performed with this format.  I connected with Tracie Noble on 02/26/20 at 3:07 pm  by telephone and verified that I am speaking with the correct person using two identifiers. Tracie Noble is currently located at car and husband is currently with her during visit. The provider, Evelina Dun, FNP is located in their office at time of visit.  I discussed the limitations, risks, security and privacy concerns of performing an evaluation and management service by telephone and the availability of in person appointments. I also discussed with the patient that there may be a patient responsible charge related to this service. The patient expressed understanding and agreed to proceed.   History and Present Illness:  Pt calls the office today with cough. Her husband was just tested positive for COVID and is getting MOA infusion.  Cough This is a new problem. The current episode started in the past 7 days. The problem has been unchanged. The problem occurs every few minutes. The cough is non-productive. Associated symptoms include nasal congestion and postnasal drip. Pertinent negatives include no chills, ear congestion, ear pain, fever, myalgias, sore throat, shortness of breath, weight loss or wheezing. She has tried rest and OTC cough suppressant for the symptoms. The treatment provided mild relief.      Review of Systems  Constitutional: Negative for chills, fever and weight loss.  HENT: Positive for postnasal drip. Negative for ear pain and sore throat.   Respiratory:  Positive for cough. Negative for shortness of breath and wheezing.   Musculoskeletal: Negative for myalgias.  All other systems reviewed and are negative.    Observations/Objective: No SOB or distress noted   Assessment and Plan: Tracie Noble comes in today with chief complaint of No chief complaint on file.   Diagnosis and orders addressed:  1. Cough - Novel Coronavirus, NAA ([facility]); Future  2. Exposure to COVID-19 virus - Novel Coronavirus, NAA ([facility]); Future  3. Encounter by telehealth for suspected COVID-19 - Novel Coronavirus, NAA ([facility]); Future  Force fluids Tylenol  Getting MOA infusion today Rest Quarantine      I discussed the assessment and treatment plan with the patient. The patient was provided an opportunity to ask questions and all were answered. The patient agreed with the plan and demonstrated an understanding of the instructions.   The patient was advised to call back or seek an in-person evaluation if the symptoms worsen or if the condition fails to improve as anticipated.  The above assessment and management plan was discussed with the patient. The patient verbalized understanding of and has agreed to the management plan. Patient is aware to call the clinic if symptoms persist or worsen. Patient is aware when to return to the clinic for a follow-up visit. Patient educated on when it is appropriate to go to the emergency department.   Time call ended:  3:18 pm   I provided 11 minutes of non-face-to-face time during this encounter.    Evelina Dun, FNP

## 2020-02-26 NOTE — Discharge Instructions (Signed)

## 2020-02-26 NOTE — Progress Notes (Signed)
I connected by phone with Tracie Noble on 02/26/2020 at 10:18 AM to discuss the potential use of a new treatment for mild to moderate COVID-19 viral infection in non-hospitalized patients.  This patient is a 64 y.o. female that meets the FDA criteria for Emergency Use Authorization of COVID monoclonal antibody casirivimab/imdevimab or bamlanivimab/eteseviamb.  Has a (+) direct SARS-CoV-2 viral test result  Has mild or moderate COVID-19   Is NOT hospitalized due to COVID-19  Is within 10 days of symptom onset  Has at least one of the high risk factor(s) for progression to severe COVID-19 and/or hospitalization as defined in EUA.  Specific high risk criteria : Other high risk medical condition per CDC:  Social vulnerability risk with uninsured    I have spoken and communicated the following to the patient or parent/caregiver regarding COVID monoclonal antibody treatment:  1. FDA has authorized the emergency use for the treatment of mild to moderate COVID-19 in adults and pediatric patients with positive results of direct SARS-CoV-2 viral testing who are 76 years of age and older weighing at least 40 kg, and who are at high risk for progressing to severe COVID-19 and/or hospitalization.  2. The significant known and potential risks and benefits of COVID monoclonal antibody, and the extent to which such potential risks and benefits are unknown.  3. Information on available alternative treatments and the risks and benefits of those alternatives, including clinical trials.  4. Patients treated with COVID monoclonal antibody should continue to self-isolate and use infection control measures (e.g., wear mask, isolate, social distance, avoid sharing personal items, clean and disinfect "high touch" surfaces, and frequent handwashing) according to CDC guidelines.   5. The patient or parent/caregiver has the option to accept or refuse COVID monoclonal antibody treatment.  After reviewing this  information with the patient, the patient has agreed to receive one of the available covid 19 monoclonal antibodies and will be provided an appropriate fact sheet prior to infusion. [name], NP 02/26/2020 10:18 AM

## 2020-02-26 NOTE — Progress Notes (Signed)
  Diagnosis: COVID-19  Physician: Dr Joya Gaskins  Procedure: Covid Infusion Clinic Med: casirivimab\imdevimab infusion - Provided patient with casirivimab\imdevimab fact sheet for patients, parents and caregivers prior to infusion.  Complications: No immediate complications noted.  Discharge: Discharged home   Christella Noa Albarece 02/26/2020

## 2020-02-26 NOTE — Telephone Encounter (Signed)
Called to Discuss with patient about Covid symptoms and the use of the monoclonal antibody infusion for those with mild to moderate Covid symptoms and at a high risk of hospitalization.     Pt appears to qualify for this infusion due to co-morbid conditions and/or a member of an at-risk group in accordance with the FDA Emergency Use Authorization.    Social risk being uninsured. No test but presumed positive with husband + in the home and classic symptoms now for 2 days.

## 2020-02-29 ENCOUNTER — Other Ambulatory Visit: Payer: Self-pay

## 2020-02-29 DIAGNOSIS — Z20822 Contact with and (suspected) exposure to covid-19: Secondary | ICD-10-CM

## 2020-03-02 LAB — SARS-COV-2, NAA 2 DAY TAT

## 2020-03-02 LAB — NOVEL CORONAVIRUS, NAA: SARS-CoV-2, NAA: DETECTED — AB

## 2020-08-04 IMAGING — US VENOUS DOPPLER ULTRASOUND OF LEFT LOWER EXTREMITY
1 series · 13 of 24 positions shown · non-contrast
Comparison: None.

CLINICAL DATA: 62-year-old female with a history of left lower
extremity pain and swelling



[Series 1: venous doppler ultrasound of left lower extremity · 0.07mm/px · 13 of 37 slices shown]
[im 1/37]
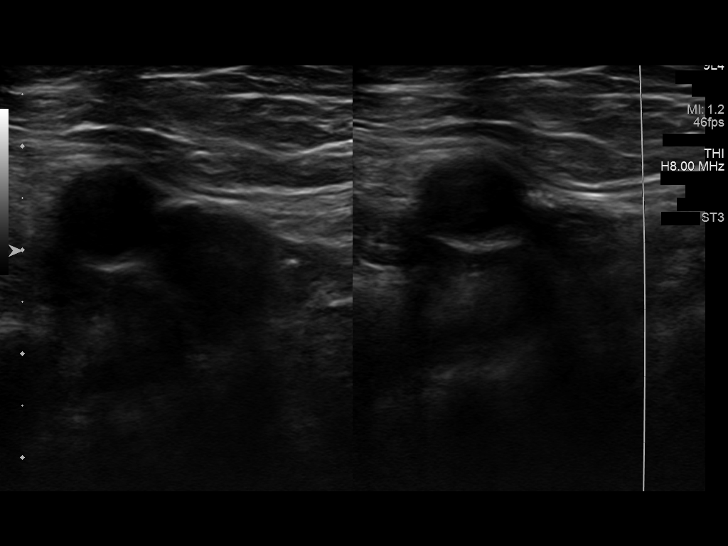
[im 4/37]
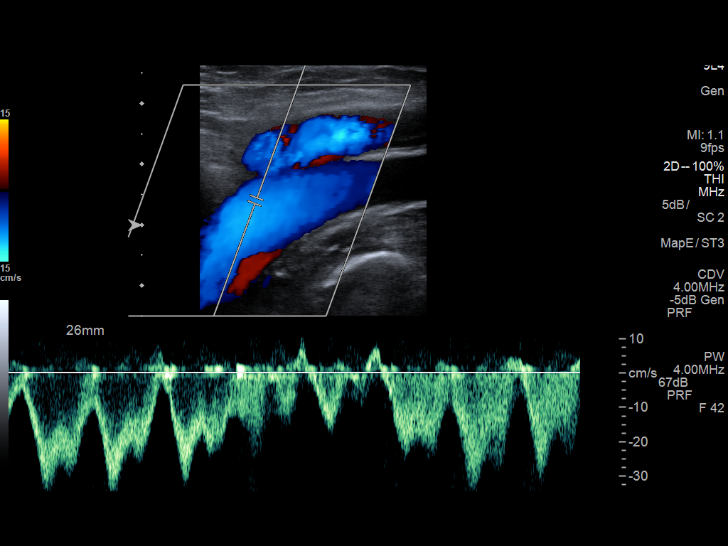
[im 7/37]
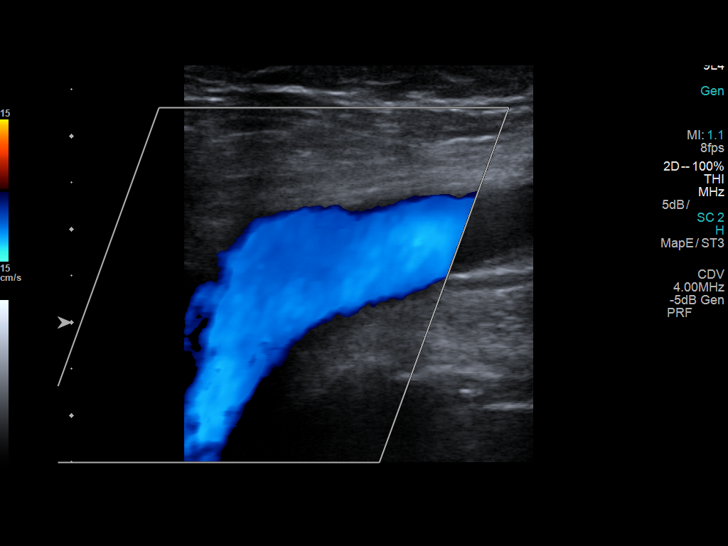
[im 10/37]
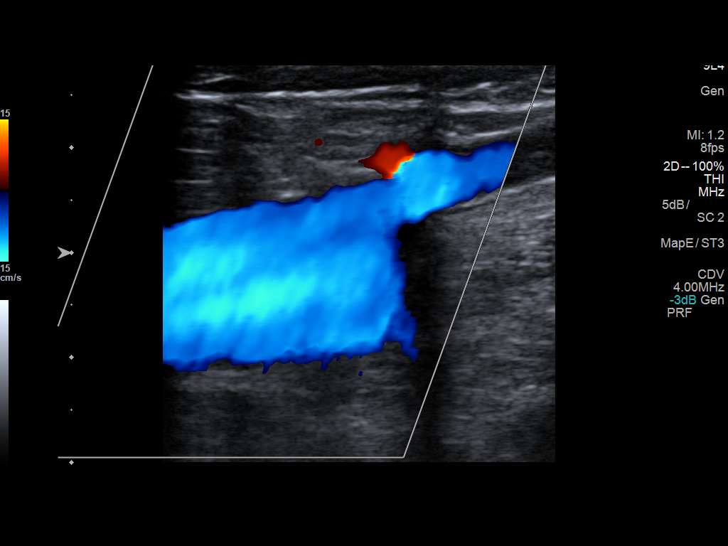
[im 13/37]
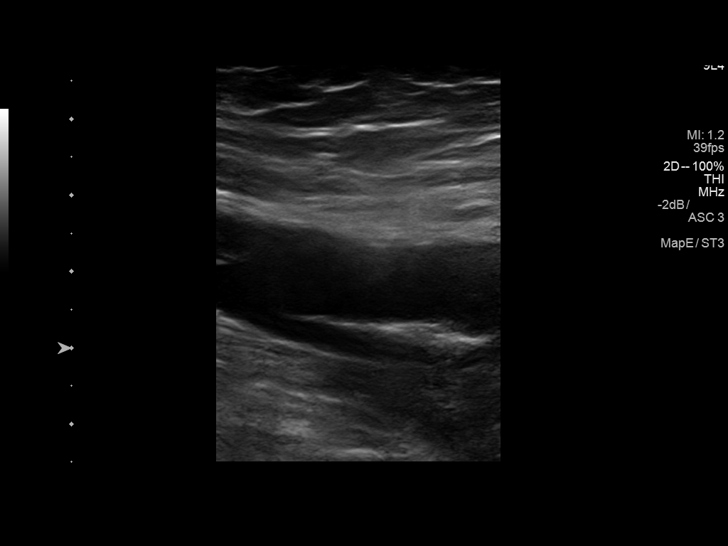
[im 16/37]
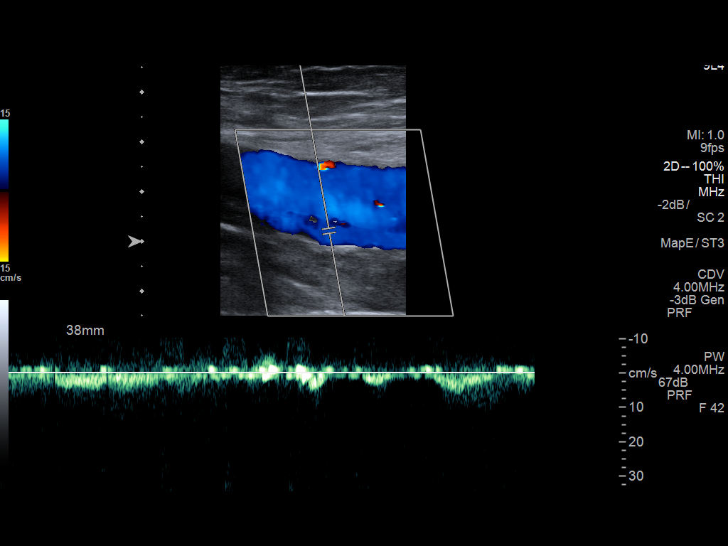
[im 19/37]
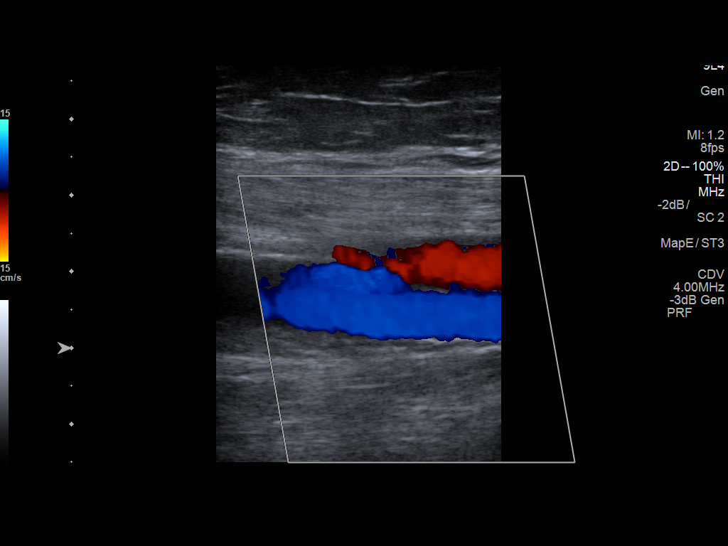
[im 21/37]
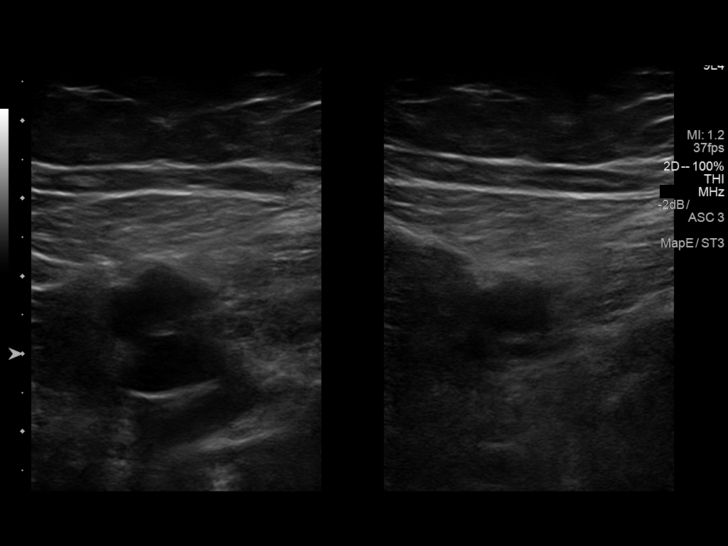
[im 24/37]
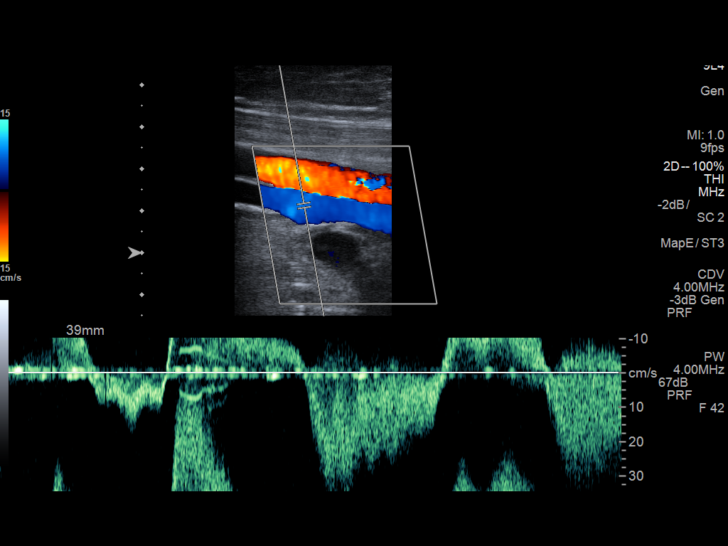
[im 27/37]
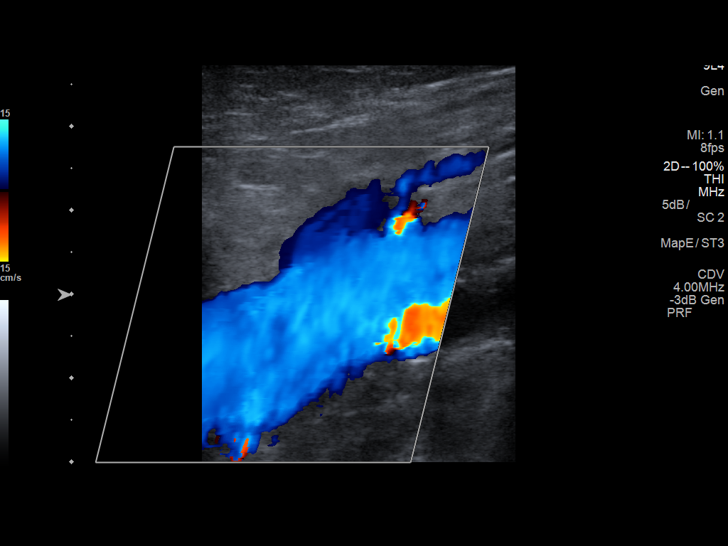
[im 30/37]
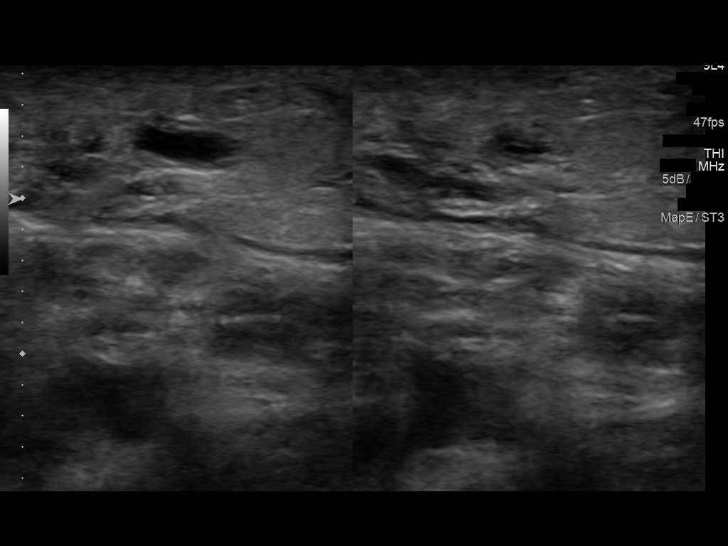
[im 33/37]
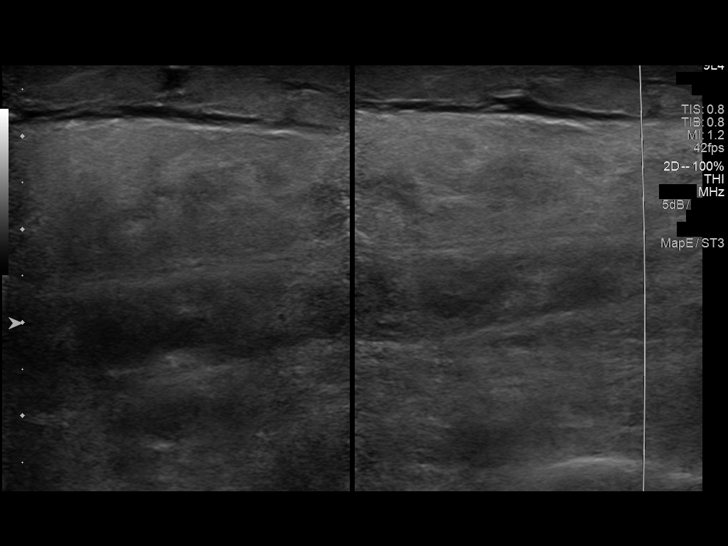
[im 37/37]
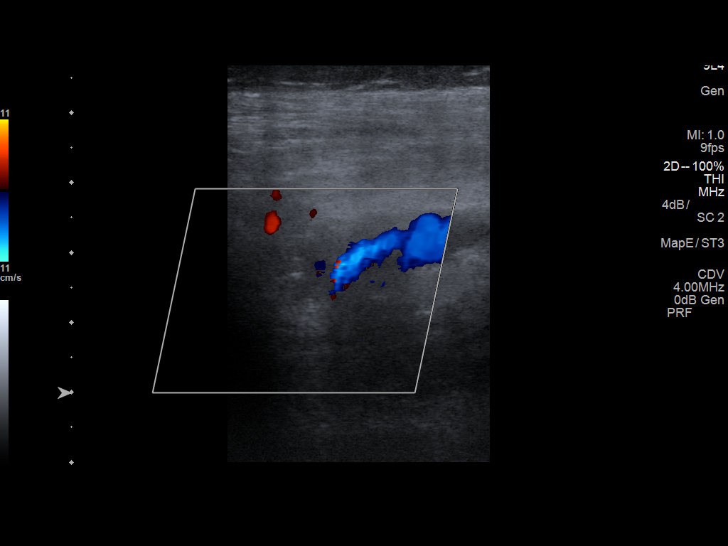

[13 of 24 positions shown; findings below may reference images not displayed]

FINDINGS: Contralateral Common Femoral Vein: Respiratory phasicity is normal
and symmetric with the symptomatic side. No evidence of thrombus.
Normal compressibility.

Common Femoral Vein: No evidence of thrombus. Normal
compressibility, respiratory phasicity and response to augmentation.

Saphenofemoral Junction: No evidence of thrombus. Normal
compressibility and flow on color Doppler imaging.

Profunda Femoral Vein: No evidence of thrombus. Normal
compressibility and flow on color Doppler imaging.

Femoral Vein: No evidence of thrombus. Normal compressibility,
respiratory phasicity and response to augmentation.

Popliteal Vein: No evidence of thrombus. Normal compressibility,
respiratory phasicity and response to augmentation.

Calf Veins: No evidence of thrombus. Normal compressibility and flow
on color Doppler imaging.

Superficial Great Saphenous Vein: No evidence of thrombus. Normal
compressibility and flow on color Doppler imaging.

Other Findings:  None.
IMPRESSION: Sonographic survey of the left lower extremity negative for DVT

## 2020-09-30 ENCOUNTER — Other Ambulatory Visit: Payer: Self-pay

## 2020-09-30 ENCOUNTER — Ambulatory Visit (INDEPENDENT_AMBULATORY_CARE_PROVIDER_SITE_OTHER): Payer: Self-pay | Admitting: Family Medicine

## 2020-09-30 ENCOUNTER — Encounter: Payer: Self-pay | Admitting: Family Medicine

## 2020-09-30 VITALS — BP 128/83 | HR 85 | Temp 97.0°F | Ht 71.0 in | Wt 154.4 lb

## 2020-09-30 DIAGNOSIS — L503 Dermatographic urticaria: Secondary | ICD-10-CM

## 2020-09-30 NOTE — Patient Instructions (Signed)
Zyrtec or Xyzal if it becomes itchy/ bothersome  Basic Skin Care Your skin plays an important role in keeping the entire body healthy.  Below are some tips on how to try and maximize skin health from the outside in.  1) Bathe in mildly warm water every 1 to 3 days, followed by light drying and an application of a thick moisturizer cream or ointment, preferably one that comes in a tub. a. Fragrance free moisturizing bars or body washes are preferred such as Purpose, Cetaphil, Dove sensitive skin, Aveeno, [employer] or Vanicream products. b. Use a fragrance free cream or ointment, not a lotion, such as plain petroleum jelly or Vaseline ointment, Aquaphor, Vanicream, Eucerin cream or a generic version, CeraVe Cream, Cetaphil Restoraderm, Aveeno Eczema Therapy and [employer], among others. c. Children with very dry skin often need to put on these creams two, three or four times a day.  As much as possible, use these creams enough to keep the skin from looking dry. d. Consider using fragrance free/dye free detergent, such as Arm and Hammer for sensitive skin, Tide Free or All Free.   2) If I am prescribing a medication to go on the skin, the medicine goes on first to the areas that need it, followed by a thick cream as above to the entire body.  3) Nancy Fetter is a major cause of damage to the skin. a. I recommend sun protection for all of my patients. I prefer physical barriers such as hats with wide brims that cover the ears, long sleeve clothing with SPF protection including rash guards for swimming. These can be found seasonally at outdoor clothing companies, [employer] and [employer] and online at [employer]in.com, www.uvskinz.com and PlayDetails.hu. Avoid peak sun between the hours of 10am to 3pm to minimize sun exposure.  b. I recommend sunscreen for all of my patients older than 25 months of age when in the sun, preferably with broad spectrum coverage and SPF 30 or higher.  i. For  children, I recommend sunscreens that only contain titanium dioxide and/or zinc oxide in the active ingredients. These do not burn the eyes and appear to be safer than chemical sunscreens. These sunscreens include zinc oxide paste found in the diaper section, Vanicream Broad Spectrum 50+, Aveeno Natural Mineral Protection, Neutrogena Pure and Free Baby, Johnson and [employer] Daily face and body lotion, Bed Bath & Beyond, among others. ii. There is no such thing as waterproof sunscreen. All sunscreens should be reapplied after 60-80 minutes of wear.  iii. Spray on sunscreens often use chemical sunscreens which do protect against the sun. However, these can be difficult to apply correctly, especially if wind is present, and can be more likely to irritate the skin.  Long term effects of chemical sunscreens are also not fully known.

## 2020-09-30 NOTE — Progress Notes (Signed)
   Subjective: CC: Rash PCP: Janora Norlander, DO Tracie Noble is a 65 y.o. female presenting to clinic today for:  1.  Rash  Patient reports abrupt onset of rash at the end of January.  Rash seems to be intermittent but she will get red patches.  Baths seem to precipitate this.  She notes that the patches sometimes can be itchy.  She is not use any medications.  No known exposures.  She bathes with Dove  ROS: Per HPI  No Known Allergies Past Medical History:  Diagnosis Date  . 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 Social History   Socioeconomic History  . Marital status: Married    Spouse name: Not on file  . Number of children: Not on file  . Years of education: Not on file  . Highest education level: Not on file  Occupational History  . Not on file  Tobacco Use  . Smoking status: Former Smoker    Packs/day: 0.25    Years: 10.00    Pack years: 2.50    Quit date: 09/06/2002    Years since quitting: 18.0  . Smokeless tobacco: Never Used  Vaping Use  . Vaping Use: Never used  Substance and Sexual Activity  . Alcohol use: No  . Drug use: No  . Sexual activity: Yes    Comment: married 7 years, on daughter and grankids  Other Topics Concern  . Not on file  Social History Narrative  . Not on file   Social Determinants of Health   Financial Resource Strain: Not on file  Food Insecurity: Not on file  Transportation Needs: Not on file  Physical Activity: Not on file  Stress: Not on file  Social Connections: Not on file  Intimate Partner Violence: Not on file   Family History  Problem Relation Age of Onset  . Dementia Mother   . Cancer Father        lymph nodes, non hodgkins lymphoma  . Diabetes Father     Objective: Office vital signs reviewed. BP 128/83   Pulse 85   Temp (!) 97 F (36.1 C)   Ht 5\' 11"  (1.803 m)   Wt 154 lb 6.4 oz (70 kg)   SpO2 98%   BMI 21.53 kg/m   Physical Examination:   General: Awake, alert, well nourished, No acute distress Skin: Minimally raised, blanching erythematous lesion (appeared worse on the photograph that she provided)  Assessment/ Plan: 65 y.o. female   Dermatographic urticaria  Have a high suspicion for dermatographia.  We discussed use of antihistamine like Zyrtec or Claritin.  We discussed reasons for reevaluation.  Handout was provided with information.  Avoid overheating and/or sweating as this will exacerbate her urticaria  No orders of the defined types were placed in this encounter.  No orders of the defined types were placed in this encounter.    Janora Norlander, DO [place] [phone]

## 2021-08-23 ENCOUNTER — Encounter: Payer: Self-pay | Admitting: Nurse Practitioner

## 2021-08-23 ENCOUNTER — Ambulatory Visit: Payer: BC Managed Care – PPO | Admitting: Nurse Practitioner

## 2021-08-23 VITALS — BP 137/81 | HR 64 | Temp 98.2°F | Ht 71.0 in | Wt 157.0 lb

## 2021-08-23 DIAGNOSIS — T148XXA Other injury of unspecified body region, initial encounter: Secondary | ICD-10-CM

## 2021-08-23 DIAGNOSIS — S91002A Unspecified open wound, left ankle, initial encounter: Secondary | ICD-10-CM | POA: Diagnosis not present

## 2021-08-23 MED ORDER — CEPHALEXIN 500 MG PO CAPS: 500.0000 mg | ORAL_CAPSULE | Freq: Two times a day (BID) | ORAL | 0 refills | Status: DC

## 2021-08-23 NOTE — Patient Instructions (Signed)
Wound Infection A wound infection happens when germs start to grow in a wound. Germs that cause wound infections are most often bacteria. Other types of infections can occur as well. An infection can cause the wound to break open. Wound infections needtreatment. If a wound infection is not treated, problems can happen. What are the causes? Most often caused by germs (bacteria) that grow in a wound. Other germs, such as yeast and funguses, can also cause wound infections. What increases the risk? Having a weak body defense system (immune system). Having diabetes. Taking certain medicines (steroids) for a long time. Smoking. Being an older person. Being overweight. Taking certain medicines for cancer treatment. What are the signs or symptoms? Having more redness, swelling, or pain at the wound site. Having more blood or fluid at the wound site. A bad smell coming from a wound or bandage (dressing). Having a fever. Feeling very tired. Having warmth at or around the wound. Having pus at the wound site. How is this treated? This condition is most often treated with an antibiotic medicine. The infection should improve 24-48 hours after you start antibiotics. After 24-48 hours, redness around the wound should stop spreading. The wound should also be less painful. Follow these instructions at home: Medicines Take or apply over-the-counter and prescription medicines only as told by your doctor. If you were prescribed an antibiotic medicine, take or apply it as told by your doctor. Do not stop using the antibiotic even if you start to feel better. Wound care  Clean the wound each day, or as told by your doctor. Wash the wound with mild soap and water. Rinse the wound with water to remove all soap. Pat the wound dry with a clean towel. Do not rub it. Follow instructions from your doctor about how to take care of your wound. Make sure you: Wash your hands with soap and water before and after  you change your bandage. If you cannot use soap and water, use hand sanitizer. Change your bandage as told by your doctor. Leave stitches (sutures), skin glue, or skin tape (adhesive) strips in place if your wound has been closed. They may need to stay in place for 2 weeks or longer. If tape strips get loose and curl up, you may trim the loose edges. Do not remove tape strips completely unless your doctor says it is okay. Some wounds are left open to heal on their own. Check your wound every day for signs of infection. Watch for: More redness, swelling, or pain. More fluid or blood. Warmth. Pus or a bad smell.  General instructions Keep the bandage dry until your doctor says it can be removed. Do not take baths, swim, or use a hot tub until your doctor approves. Ask your doctor if you may take showers. You may only be allowed to take sponge baths. Raise (elevate) the injured area above the level of your heart while you are sitting or lying down. Do not scratch or pick at the wound. Keep all follow-up visits as told by your doctor. This is important. Contact a doctor if: Medicine does not help your pain. You have more redness, swelling, or pain around your wound. You have more fluid or blood coming from your wound. Your wound feels warm to the touch. You have pus coming from your wound. You notice a bad smell coming from your wound or your bandage. Your wound that was closed breaks open. Get help right away if: You have a red streak   going away from your wound. You have a fever. Summary A wound infection happens when germs start to grow in a wound. This condition is usually treated with an antibiotic medicine. Follow instructions from your doctor about how to take care of your wound. Contact a doctor if your wound infection does not start to get better in 24-48 hours, or your symptoms get worse. Keep all follow-up visits as told by your doctor. This is important. This information is not  intended to replace advice given to you by your health care provider. Make sure you discuss any questions you have with your healthcare provider. Document Revised: 12/31/2017 Document Reviewed: 12/31/2017 Elsevier Patient Education  2022 Elsevier Inc.  

## 2021-08-23 NOTE — Progress Notes (Signed)
? ? ? ?Acute Office Visit ? ?Subjective:  ? ? Patient ID: Tracie Noble, female    DOB: December 20, 1955, 66 y.o.   MRN: [id number] ? ?Chief Complaint  ?Patient presents with  ? Wound Check  ? ? ?HPI ?Patient is in today for Wound Check: Patient presents for wound check. Patient has a ulceration due to unknown cause wound which is located on the  left lateral ankle . Current symptoms: erythema: mild ?drainage: . Symptoms began a few weeks ago. Pain is rated 3/10. Interventions to date: none.  ? ? ? ? ?Past Medical History:  ?Diagnosis Date  ? Cancer [facility])   ? cervical at age 46   ? ? ?Past Surgical History:  ?Procedure Laterality Date  ? ABDOMINAL HYSTERECTOMY    ? JOINT REPLACEMENT    ? left- leg  ? ? ?Family History  ?Problem Relation Age of Onset  ? Dementia Mother   ? Cancer Father   ?     lymph nodes, non hodgkins lymphoma  ? Diabetes Father   ? ? ?Social History  ? ?Socioeconomic History  ? Marital status: Married  ?  Spouse name: Not on file  ? Number of children: Not on file  ? Years of education: Not on file  ? Highest education level: Not on file  ?Occupational History  ? Not on file  ?Tobacco Use  ? Smoking status: Former  ?  Packs/day: 0.25  ?  Years: 10.00  ?  Pack years: 2.50  ?  Types: Cigarettes  ?  Quit date: 09/06/2002  ?  Years since quitting: 18.9  ? Smokeless tobacco: Never  ?Vaping Use  ? Vaping Use: Never used  ?Substance and Sexual Activity  ? Alcohol use: No  ? Drug use: No  ? Sexual activity: Yes  ?  Comment: married 7 years, on daughter and grankids  ?Other Topics Concern  ? Not on file  ?Social History Narrative  ? Not on file  ? ?Social Determinants of Health  ? ?Financial Resource Strain: Not on file  ?Food Insecurity: Not on file  ?Transportation Needs: Not on file  ?Physical Activity: Not on file  ?Stress: Not on file  ?Social Connections: Not on file  ?Intimate Partner Violence: Not on file  ? ? ?No outpatient medications prior to visit.  ? ?No facility-administered medications prior to  visit.  ? ? ?No Known Allergies ? ?Review of Systems  ?Constitutional: Negative.   ?HENT: Negative.    ?Eyes: Negative.   ?Respiratory: Negative.    ?Gastrointestinal: Negative.   ?Musculoskeletal: Negative.   ?Skin:  Positive for wound. Negative for rash.  ?All other systems reviewed and are negative. ? ?   ?Objective:  ?  ?Physical Exam ?Vitals and nursing note reviewed.  ?Constitutional:   ?   Appearance: Normal appearance.  ?HENT:  ?   Right Ear: External ear normal.  ?   Left Ear: External ear normal.  ?   Nose: Nose normal.  ?   Mouth/Throat:  ?   Mouth: Mucous membranes are moist.  ?   Pharynx: Oropharynx is clear.  ?Eyes:  ?   Conjunctiva/sclera: Conjunctivae normal.  ?Cardiovascular:  ?   Rate and Rhythm: Normal rate and regular rhythm.  ?   Pulses: Normal pulses.  ?   Heart sounds: Normal heart sounds.  ?Pulmonary:  ?   Effort: Pulmonary effort is normal.  ?   Breath sounds: Normal breath sounds.  ?Abdominal:  ?   General:  Bowel sounds are normal.  ?Skin: ?   Findings: Erythema and wound present.  ?Neurological:  ?   Mental Status: She is alert and oriented to person, place, and time.  ?Psychiatric:     ?   Behavior: Behavior normal.  ? ? ?BP 137/81   Pulse 64   Temp 98.2 ?F (36.8 ?C)   Ht '5\' 11"'$  (1.803 m)   Wt 157 lb (71.2 kg)   SpO2 98%   BMI 21.90 kg/m?  ?Wt Readings from Last 3 Encounters:  ?08/23/21 157 lb (71.2 kg)  ?09/30/20 154 lb 6.4 oz (70 kg)  ?10/21/19 158 lb (71.7 kg)  ? ? ?Health Maintenance Due  ?Topic Date Due  ? COVID-19 Vaccine (1) Never done  ? HIV Screening  Never done  ? Hepatitis C Screening  Never done  ? COLONOSCOPY (Pts 45-20yr Insurance coverage will need to be confirmed)  Never done  ? Zoster Vaccines- Shingrix (1 of 2) Never done  ? MAMMOGRAM  10/02/2019  ? PAP SMEAR-Modifier  08/30/2020  ? ? ?There are no preventive care reminders to display for this patient. ? ? ?No results found for: TSH ?Lab Results  ?Component Value Date  ? WBC 5.5 08/30/2017  ? HGB 12.4 08/30/2017   ? HCT 37.6 08/30/2017  ? MCV 89 08/30/2017  ? PLT 212 08/30/2017  ? ?Lab Results  ?Component Value Date  ? NA 143 08/30/2017  ? K 3.9 08/30/2017  ? CO2 27 08/30/2017  ? GLUCOSE 79 08/30/2017  ? BUN 12 08/30/2017  ? CREATININE 0.75 08/30/2017  ? BILITOT 0.3 08/30/2017  ? ALKPHOS 95 08/30/2017  ? AST 17 08/30/2017  ? ALT 17 08/30/2017  ? PROT 7.1 08/30/2017  ? ALBUMIN 4.6 08/30/2017  ? CALCIUM 9.7 08/30/2017  ? ?Lab Results  ?Component Value Date  ? CHOL 190 08/30/2017  ? ?Lab Results  ?Component Value Date  ? HDL 72 08/30/2017  ? ?Lab Results  ?Component Value Date  ? LDLCALC 106 (H) 08/30/2017  ? ?Lab Results  ?Component Value Date  ? TRIG 58 08/30/2017  ? ?Lab Results  ?Component Value Date  ? CHOLHDL 2.6 08/30/2017  ? ?No results found for: HGBA1C ? ?   ?Assessment & Plan:  ? ?Old scab on left lateral ankle recently started to bleed with mild discharge. Wound cleaned and dressed, patient tolerated well. started patient on antibiotic for 7 days. Provided patient with education to observe for infection and return with unresolved symptoms.  ? ?Patient may need dermatology referral to rule out any skin cancer. If area is not improving.  ? ? ?Problem List Items Addressed This Visit   ?None ?Visit Diagnoses   ? ? Wound discharge    -  Primary  ? Relevant Medications  ? cephALEXin (KEFLEX) 500 MG capsule  ? ?  ? ? ? ?Meds ordered this encounter  ?Medications  ? cephALEXin (KEFLEX) 500 MG capsule  ?  Sig: Take 1 capsule (500 mg total) by mouth 2 (two) times daily.  ?  Dispense:  14 capsule  ?  Refill:  0  ?  Order Specific Question:   Supervising Provider  ?  Answer:Claretta Fraise[[[id number] ? ? ? ?OIvy Lynn NP ? ?

## 2021-09-05 ENCOUNTER — Telehealth: Payer: BC Managed Care – PPO | Admitting: Family Medicine

## 2021-09-11 ENCOUNTER — Ambulatory Visit: Payer: BC Managed Care – PPO | Admitting: Nurse Practitioner

## 2021-09-12 ENCOUNTER — Encounter: Payer: Self-pay | Admitting: Family Medicine

## 2021-09-12 ENCOUNTER — Ambulatory Visit: Payer: BC Managed Care – PPO | Admitting: Family Medicine

## 2021-09-12 VITALS — BP 141/83 | HR 80 | Temp 97.0°F | Ht 71.0 in | Wt 157.4 lb

## 2021-09-12 DIAGNOSIS — S91302A Unspecified open wound, left foot, initial encounter: Secondary | ICD-10-CM | POA: Diagnosis not present

## 2021-09-12 DIAGNOSIS — M7662 Achilles tendinitis, left leg: Secondary | ICD-10-CM

## 2021-09-12 MED ORDER — SILVER SULFADIAZINE 1 % EX CREA: 1.0000 "application " | TOPICAL_CREAM | Freq: Every day | CUTANEOUS | 0 refills | Status: DC

## 2021-09-12 NOTE — Progress Notes (Signed)
? ?Subjective: ?CC: Nonhealing wound ?PCP: Janora Norlander, DO ?VEH:MCNOBS Tracie Noble is a 66 y.o. female presenting to clinic today for: ? ?1.  Nonhealing wound ?Patient reports that she has been having a lesion on the left medial ankle that was evaluated on 3/22 by J.  It was felt to possibly be infected.  She is status post treatment with Keflex twice daily.  She denies any significant drainage.  She has not had any fevers.  She does report some mild irritation in that area but has been keeping the area clean.  She admits that she does use soaking baths and has been using Neosporin. ? ?2.  Achilles pain ?Patient reports pain along the Achilles tendon of that left foot as well.  She has been utilizing topical analgesics with some relief but she was not sure if this was safe to continue doing long-term.  She does not report any sensory changes or preceding injury.  She does have a pretty prominent vessel over that area. ? ? ?ROS: Per HPI ? ?No Known Allergies ?Past Medical History:  ?Diagnosis Date  ? Cancer [facility])   ? cervical at age 68   ? ?No current outpatient medications on file. ?Social History  ? ?Socioeconomic History  ? Marital status: Married  ?  Spouse name: Not on file  ? Number of children: Not on file  ? Years of education: Not on file  ? Highest education level: Not on file  ?Occupational History  ? Not on file  ?Tobacco Use  ? Smoking status: Former  ?  Packs/day: 0.25  ?  Years: 10.00  ?  Pack years: 2.50  ?  Types: Cigarettes  ?  Quit date: 09/06/2002  ?  Years since quitting: 19.0  ? Smokeless tobacco: Never  ?Vaping Use  ? Vaping Use: Never used  ?Substance and Sexual Activity  ? Alcohol use: No  ? Drug use: No  ? Sexual activity: Yes  ?  Comment: married 7 years, on daughter and grankids  ?Other Topics Concern  ? Not on file  ?Social History Narrative  ? Not on file  ? ?Social Determinants of Health  ? ?Financial Resource Strain: Not on file  ?Food Insecurity: Not on file  ?Transportation Needs:  Not on file  ?Physical Activity: Not on file  ?Stress: Not on file  ?Social Connections: Not on file  ?Intimate Partner Violence: Not on file  ? ?Family History  ?Problem Relation Age of Onset  ? Dementia Mother   ? Cancer Father   ?     lymph nodes, non hodgkins lymphoma  ? Diabetes Father   ? ? ?Objective: ?Office vital signs reviewed. ?BP (!) 141/83   Pulse 80   Temp (!) 97 ?F (36.1 ?C)   Ht '5\' 11"'$  (1.803 m)   Wt 157 lb 6.4 oz (71.4 kg)   SpO2 96%   BMI 21.95 kg/m?  ? ?Physical Examination:  ?General: Awake, alert, well nourished, nontoxic female.  No acute distress ?Extremities: warm, well perfused, No edema, cyanosis or clubbing; +2 pulses bilaterally.  Prominent varicose vein noted along the base of the Achilles tendon on the left ?MSK: Ambulating independently.  She has tenderness to palpation along the Achilles heel.  Both flexion and extension of the ankle is preserved. ?Skin: She has about a 2-1/2 mm well-circumscribed open wound along the medial left ankle just behind the medial malleolus.  There is no active bleeding or exudates appreciated.  She has minimal erythema  that may be reflective of granulomatous tissue. ? ?Assessment/ Plan: ?66 y.o. female  ? ?Non-healing wound of left heel - Plan: silver sulfADIAZINE (SSD) 1 % cream ? ?Achilles tendinitis of left lower extremity ? ?No evidence of secondary infection.  I agree with the nurse practitioner that this could represent a squamous cell carcinoma given reports of having knocked off a scab and its not healing now.  However, I am going to trial her on SSD cream for the next 2 weeks as well as Xeroform bandages.  Discussed instructions for wound care and reasons to be evaluated sooner than her 2-week follow-up.  She voiced good understanding.  If no significant improvement by that time certainly we will be planning for dermatology referral plus or minus evaluation by vascular.  However, no evidence of vascular insufficiency or PAD on exam ? ?The  tenderness that she is getting along the Achilles is likely representative of tendinitis.  I given her home physical therapy stretches to perform.  If we do not see any significant improvement with this over the next few weeks we can certainly refer to Dr. Micheline Chapman with sports medicine center in [place].  May consider ultrasound at that point.  Okay to continue topical analgesics if she finds this helpful.  Highly recommended supportive footwear. ? ?No orders of the defined types were placed in this encounter. ? ?No orders of the defined types were placed in this encounter. ? ? ? ?Janora Norlander, DO ?[place] ?[phone] ? ?

## 2021-09-12 NOTE — Patient Instructions (Signed)
Wound Care, Adult ?Taking care of your wound properly can help to prevent pain, infection, and scarring. It can also help your wound heal more quickly. Follow instructions from your health care provider about how to care for your wound. ?Supplies needed: ?Soap and water. ?Wound cleanser, saline, or germ-free (sterile) water. ?Gauze. ?If needed, a clean bandage (dressing) or other type of wound dressing material to cover or place in the wound. Follow your health care provider's instructions about what dressing supplies to use. ?Cream or topical ointment to apply to the wound, if told by your health care provider. ?How to care for your wound ?Cleaning the wound ?Ask your health care provider how to clean the wound. This may include: ?Using mild soap and water, a wound cleanser, saline, or sterile water. ?Using a clean gauze to pat the wound dry after cleaning it. Do not rub or scrub the wound. ?Dressing care ?Wash your hands with soap and water for at least 20 seconds before and after you change the dressing. If soap and water are not available, use hand sanitizer. ?Change your dressing as told by your health care provider. This may include: ?Cleaning or rinsing out (irrigating) the wound. ?Application of cream or topical ointment, if told by your health care provider. ?Placing a dressing over the wound or in the wound (packing). ?Covering the wound with an outer dressing. ?Leave stitches (sutures), staples, skin glue, or adhesive strips in place. These skin closures may need to stay in place for 2 weeks or longer. If adhesive strip edges start to loosen and curl up, you may trim the loose edges. Do not remove adhesive strips completely unless your health care provider tells you to do that. ?Ask your health care provider when you can leave the wound uncovered. ?Checking for infection ?Check your wound area every day for signs of infection. Check for: ?More redness, swelling, or pain. ?Fluid or blood. ?Warmth. ?Pus or  a bad smell. ? ?Follow these instructions at home ?Medicines ?If you were prescribed an antibiotic medicine, cream, or ointment, take or apply it as told by your health care provider. Do not stop using the antibiotic even if your condition improves. ?If you were prescribed pain medicine, take it 30 minutes before you do any wound care or as told by your health care provider. ?Take over-the-counter and prescription medicines only as told by your health care provider. ?Eating and drinking ?Eat a diet that includes protein, vitamin A, vitamin C, and other nutrient-rich foods to help the wound heal. ?Foods rich in protein include meat, fish, eggs, dairy, beans, and nuts. ?Foods rich in vitamin A include carrots and dark green, leafy vegetables. ?Foods rich in vitamin C include citrus fruits, tomatoes, broccoli, and peppers. ?Drink enough fluid to keep your urine pale yellow. ?General instructions ?Do not take baths, swim, or use a hot tub until your health care provider approves. Ask your health care provider if you may take showers. You may only be allowed to take sponge baths. ?Do not scratch or pick at the wound. Keep it covered as told by your health care provider. ?Return to your normal activities as told by your health care provider. Ask your health care provider what activities are safe for you. ?Protect your wound from the sun when you are outside for the first 6 months, or for as long as told by your health care provider. Cover up the scar area or apply sunscreen that has an SPF of at least 30. ?Do not   use any products that contain nicotine or tobacco. These products include cigarettes, chewing tobacco, and vaping devices, such as e-cigarettes. If you need help quitting, ask your health care provider. ?Keep all follow-up visits. This is important. ?Contact a health care provider if: ?You received a tetanus shot and you have swelling, severe pain, redness, or bleeding at the injection site. ?Your pain is not  controlled with medicine. ?You have any of these signs of infection: ?More redness, swelling, or pain around the wound. ?Fluid or blood coming from the wound. ?Warmth coming from the wound. ?A fever or chills. ?You are nauseous or you vomit. ?You are dizzy. ?You have a new rash or hardness around the wound. ?Get help right away if: ?You have a red streak of skin near the area around your wound. ?Pus or a bad smell coming from the wound. ?Your wound has been closed with staples, sutures, skin glue, or adhesive strips and it begins to open up and separate. ?Your wound is bleeding, and the bleeding does not stop with gentle pressure. ?These symptoms may represent a serious problem that is an emergency. Do not wait to see if the symptoms will go away. Get medical help right away. Call your local emergency services (911 in the [place] Do not drive yourself to the hospital. ?Summary ?Always wash your hands with soap and water for at least 20 seconds before and after changing your dressing. ?Change your dressing as told by your health care provider. ?To help with healing, eat foods that are rich in protein, vitamin A, vitamin C, and other nutrients. ?Check your wound every day for signs of infection. Contact your health care provider if you think that your wound is infected. ?This information is not intended to replace advice given to you by your health care provider. Make sure you discuss any questions you have with your health care provider. ?Document Revised: 09/27/2020 Document Reviewed: 09/27/2020 ?Elsevier Patient Education ? 2022 [employer] ? ?

## 2021-09-26 ENCOUNTER — Ambulatory Visit: Payer: BC Managed Care – PPO | Admitting: Family Medicine

## 2021-09-26 ENCOUNTER — Encounter: Payer: Self-pay | Admitting: Family Medicine

## 2021-09-26 VITALS — BP 131/81 | HR 63 | Temp 98.2°F | Ht 71.0 in | Wt 157.4 lb

## 2021-09-26 DIAGNOSIS — S91302A Unspecified open wound, left foot, initial encounter: Secondary | ICD-10-CM | POA: Diagnosis not present

## 2021-09-26 DIAGNOSIS — M7662 Achilles tendinitis, left leg: Secondary | ICD-10-CM | POA: Diagnosis not present

## 2021-09-26 MED ORDER — CELECOXIB 100 MG PO CAPS: ORAL_CAPSULE | ORAL | 1 refills | Status: DC

## 2021-09-26 NOTE — Progress Notes (Signed)
? ?Subjective: ?HG:DJMEQ recheck ?PCP: Janora Norlander, DO ?AST:Tracie Noble is a 66 y.o. female presenting to clinic today for: ? ?1. Wound recheck ?Patient was evaluated 2 weeks ago for nonhealing wound on the left medial heel.  She has been evaluated a few weeks prior and it was felt to be possibly infected so she was treated with oral antibiotics.  I placed her on silver Silvadene cream to the affected area and give her instructions for wound care with Xeroform bandages.  She is here for 2-week recheck.  She notes slight improvement in the wound but has had some progression of the heel pain despite home physical therapy for what was thought to be an Achilles tendinitis.  She has been applying a topical OTC pain reliever with some improvement.  She is accompanied today's visit by her husband who wonders if she might be a good candidate for something like Celebrex.  She does not report any fevers.  No drainage.  No bleeding. ? ? ?ROS: Per HPI ? ?No Known Allergies ?Past Medical History:  ?Diagnosis Date  ? Cancer [facility])   ? cervical at age 6   ? ? ?Current Outpatient Medications:  ?  silver sulfADIAZINE (SSD) 1 % cream, Apply 1 application. topically daily., Disp: 50 g, Rfl: 0 ?Social History  ? ?Socioeconomic History  ? Marital status: Married  ?  Spouse name: Not on file  ? Number of children: Not on file  ? Years of education: Not on file  ? Highest education level: Not on file  ?Occupational History  ? Not on file  ?Tobacco Use  ? Smoking status: Former  ?  Packs/day: 0.25  ?  Years: 10.00  ?  Pack years: 2.50  ?  Types: Cigarettes  ?  Quit date: 09/06/2002  ?  Years since quitting: 19.0  ? Smokeless tobacco: Never  ?Vaping Use  ? Vaping Use: Never used  ?Substance and Sexual Activity  ? Alcohol use: No  ? Drug use: No  ? Sexual activity: Yes  ?  Comment: married 7 years, on daughter and grankids  ?Other Topics Concern  ? Not on file  ?Social History Narrative  ? Not on file  ? ?Social Determinants of  Health  ? ?Financial Resource Strain: Not on file  ?Food Insecurity: Not on file  ?Transportation Needs: Not on file  ?Physical Activity: Not on file  ?Stress: Not on file  ?Social Connections: Not on file  ?Intimate Partner Violence: Not on file  ? ?Family History  ?Problem Relation Age of Onset  ? Dementia Mother   ? Cancer Father   ?     lymph nodes, non hodgkins lymphoma  ? Diabetes Father   ? ? ?Objective: ?Office vital signs reviewed. ?BP 131/81   Pulse 63   Temp 98.2 ?F (36.8 ?C)   Ht '5\' 11"'$  (1.803 m)   Wt 157 lb 6.4 oz (71.4 kg)   SpO2 99%   BMI 21.95 kg/m?  ? ?Physical Examination:  ?General: Awake, alert, well nourished, No acute distress ?MSK: Slightly antalgic gait.  She has no significant tenderness or palpable nodules along the Achilles heel on the left ?Skin: Round, open wound noted along the medial aspect of the left heel just behind the medial malleolus.  She has tenderness and thickening at the edges of the wound but no active exudates, erythema or warmth. ? ? ? ? ? ?Assessment/ Plan: ?66 y.o. female  ? ?Non-healing wound of left heel -  Plan: Ambulatory referral to Dermatology, [place] LT LOWER EXTREM LTD SOFT TISSUE NON VASCULAR ? ?Achilles tendinitis of left lower extremity - Plan: celecoxib (CELEBREX) 100 MG capsule, [place] LT LOWER EXTREM LTD SOFT TISSUE NON VASCULAR ? ?I do think that the wound has gotten slightly more shallow but given her lack of other medical issues I would expect her to be healing at a much more rapid pace today.  She was still quite tender around the wound today and seems to be having worsening Achilles pain.  For this reason, I am obtaining an ultrasound to ensure that we do not have some type of tunneling wound.  Hopefully they can eval the thickness and integrity of the Achilles tendon at the same time.  I have also placed a referral to dermatology for further evaluation.  Of course we discussed that a skin cancer still on the table is a possible diagnosis, particularly  given lack of significant healing since her last visit.  I am going to place her on a little bit of Celebrex up to twice daily if needed.  Avoid other NSAIDs.  Okay to use topical analgesics of choice.  Further management pending evaluation and recommendations ? ?No orders of the defined types were placed in this encounter. ? ?No orders of the defined types were placed in this encounter. ? ? ? ?Janora Norlander, DO ?[place] ?[phone] ? ? ?

## 2021-09-28 ENCOUNTER — Ambulatory Visit (HOSPITAL_COMMUNITY)
Admission: RE | Admit: 2021-09-28 | Discharge: 2021-09-28 | Disposition: A | Discharge status: Home / Self Care | Payer: BC Managed Care – PPO | Source: Ambulatory Visit | Attending: Family Medicine | Admitting: Family Medicine

## 2021-09-28 DIAGNOSIS — S91302A Unspecified open wound, left foot, initial encounter: Secondary | ICD-10-CM | POA: Diagnosis present

## 2021-09-28 DIAGNOSIS — M7662 Achilles tendinitis, left leg: Secondary | ICD-10-CM | POA: Diagnosis present

## 2021-10-02 ENCOUNTER — Other Ambulatory Visit: Payer: Self-pay | Admitting: Family Medicine

## 2021-10-02 DIAGNOSIS — I83812 Varicose veins of left lower extremities with pain: Secondary | ICD-10-CM

## 2021-10-04 ENCOUNTER — Ambulatory Visit: Payer: BC Managed Care – PPO | Admitting: Family Medicine

## 2021-10-12 ENCOUNTER — Other Ambulatory Visit: Payer: Self-pay

## 2021-10-12 DIAGNOSIS — I8392 Asymptomatic varicose veins of left lower extremity: Secondary | ICD-10-CM

## 2021-10-13 ENCOUNTER — Ambulatory Visit: Payer: BC Managed Care – PPO | Admitting: Vascular Surgery

## 2021-10-13 ENCOUNTER — Encounter: Payer: Self-pay | Admitting: Vascular Surgery

## 2021-10-13 ENCOUNTER — Ambulatory Visit (HOSPITAL_COMMUNITY)
Admission: RE | Admit: 2021-10-13 | Discharge: 2021-10-13 | Disposition: A | Discharge status: Home / Self Care | Payer: BC Managed Care – PPO | Source: Ambulatory Visit | Attending: Vascular Surgery | Admitting: Vascular Surgery

## 2021-10-13 VITALS — BP 139/84 | HR 65 | Temp 98.1°F | Resp 20 | Ht 71.0 in | Wt 155.0 lb

## 2021-10-13 DIAGNOSIS — I83812 Varicose veins of left lower extremities with pain: Secondary | ICD-10-CM

## 2021-10-13 DIAGNOSIS — I872 Venous insufficiency (chronic) (peripheral): Secondary | ICD-10-CM

## 2021-10-13 DIAGNOSIS — I8392 Asymptomatic varicose veins of left lower extremity: Secondary | ICD-10-CM | POA: Insufficient documentation

## 2021-10-13 DIAGNOSIS — I8393 Asymptomatic varicose veins of bilateral lower extremities: Secondary | ICD-10-CM

## 2021-10-13 NOTE — Progress Notes (Signed)
? ?ASSESSMENT & PLAN  ? ?CHRONIC VENOUS INSUFFICIENCY: This patient has CEAP C6 venous disease (ulcer).  The wound has improved significantly and is almost completely healed.  We have discussed the importance of intermittent leg elevation and the proper positioning for this.  We will fit her for a knee-high compression stocking with a gradient of 20 to 30 mmHg.  I think she should wear these at work at all times.  We discussed importance of exercise specifically walking and water aerobics.  I have encouraged her to avoid prolonged sitting and standing.  She does have to stand for long hours at work and I encouraged her to move around as much as possible.  Fortunately she has a healthy weight.  If the ulcer does not heal or she has recurrent ulcers I think she should be considered for laser ablation of the left small saphenous vein which is significantly dilated.  She does have a vein ergotamine which extends up the thigh.  If she develops recurrent ulceration we will first try her in a thigh-high compression stocking with a gradient of 20 to 30 mmHg.  She will call if her symptoms progress or if her ulcer recurs. ? ?REASON FOR CONSULT:   ? ?Varicose veins of the left lower extremity.  The consult was requested by Dr. Lajuana Ripple. ? ?HPI:  ? ?Tracie Noble is a 66 y.o. female who was referred with chronic venous insufficiency.  The patient developed a wound on her left medial malleolus 2 to 3 months ago.  This has been very slow to heal but now has almost healed.  She stands at work for 8 to 12 hours.  She does have some aching pain in her legs and occasional swelling but for the most part the issue is the wound.  She is unaware of any previous history of DVT. ? ?She denies any history of claudication, rest pain, or nonhealing ulcers.  She does not have any risk factors for peripheral arterial disease.  She denies any history of diabetes, hypertension, hypercholesterolemia, family history of premature cardiovascular  disease, or tobacco use. ? ?Past Medical History:  ?Diagnosis Date  ? Cancer [facility])   ? cervical at age 28   ? ? ?Family History  ?Problem Relation Age of Onset  ? Dementia Mother   ? Cancer Father   ?     lymph nodes, non hodgkins lymphoma  ? Diabetes Father   ? ? ?SOCIAL HISTORY: ?Social History  ? ?Tobacco Use  ? Smoking status: Former  ?  Packs/day: 0.25  ?  Years: 10.00  ?  Pack years: 2.50  ?  Types: Cigarettes  ?  Quit date: 09/06/2002  ?  Years since quitting: 19.1  ? Smokeless tobacco: Never  ?Substance Use Topics  ? Alcohol use: No  ? ? ?No Known Allergies ? ?Current Outpatient Medications  ?Medication Sig Dispense Refill  ? silver sulfADIAZINE (SSD) 1 % cream Apply 1 application. topically daily. 50 g 0  ? celecoxib (CELEBREX) 100 MG capsule Take 1 capsule up to twice daily as needed for pain (Patient not taking: Reported on 10/13/2021) 60 capsule 1  ? ?No current facility-administered medications for this visit.  ? ? ?REVIEW OF SYSTEMS:  ?'[X]'$  denotes positive finding, '[ ]'$  denotes negative finding ?Cardiac  Comments:  ?Chest pain or chest pressure:    ?Shortness of breath upon exertion:    ?Short of breath when lying flat:    ?Irregular heart rhythm:    ?    ?  Vascular    ?Pain in calf, thigh, or hip brought on by ambulation:    ?Pain in feet at night that wakes you up from your sleep:     ?Blood clot in your veins:    ?Leg swelling:     ?    ?Pulmonary    ?Oxygen at home:    ?Productive cough:     ?Wheezing:     ?    ?Neurologic    ?Sudden weakness in arms or legs:     ?Sudden numbness in arms or legs:     ?Sudden onset of difficulty speaking or slurred speech:    ?Temporary loss of vision in one eye:     ?Problems with dizziness:     ?    ?Gastrointestinal    ?Blood in stool:     ?Vomited blood:     ?    ?Genitourinary    ?Burning when urinating:     ?Blood in urine:    ?    ?Psychiatric    ?Major depression:     ?    ?Hematologic    ?Bleeding problems:    ?Problems with blood clotting too easily:    ?     ?Skin    ?Rashes or ulcers: x   ?    ?Constitutional    ?Fever or chills:    ?- ? ?PHYSICAL EXAM:  ? ?Vitals:  ? 10/13/21 0906  ?BP: 139/84  ?Pulse: 65  ?Resp: 20  ?Temp: 98.1 ?F (36.7 ?C)  ?SpO2: 97%  ?Weight: 155 lb (70.3 kg)  ?Height: '5\' 11"'$  (1.803 m)  ? ?Body mass index is 21.62 kg/m?. ?GENERAL: The patient is a well-nourished female, in no acute distress. The vital signs are documented above. ?CARDIAC: There is a regular rate and rhythm.  ?VASCULAR: I do not detect carotid bruits. ?She has palpable femoral and pedal pulses bilaterally. ?She has hyperpigmentation bilaterally and a ulcer that is almost healed on her left medial malleolus. ? ? ?I did look at her small saphenous vein myself with the SonoSite and it is markedly dilated with reflux down to the mid calf.  She does have a vein and Jochem any I do not see a connection to the popliteal vein. ?PULMONARY: There is good air exchange bilaterally without wheezing or rales. ?ABDOMEN: Soft and non-tender with normal pitched bowel sounds.  ?MUSCULOSKELETAL: There are no major deformities. ?NEUROLOGIC: No focal weakness or paresthesias are detected. ?SKIN: There are no ulcers or rashes noted. ?PSYCHIATRIC: The patient has a normal affect. ? ?DATA:   ? ?VENOUS DUPLEX: I have independently interpreted her venous duplex scan today.  This was of the left lower extremity only. ? 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 ? ?There was deep venous reflux in the common femoral vein, femoral vein, and popliteal vein. ? ?There was superficial venous reflux from the saphenofemoral junction to the proximal thigh.  The vein was dilated up to 5 mm in the proximal thigh.  The vein exited the fascia here.  The vein below that had minimal reflux and was not especially dilated.  There was reflux in the small saphenous vein from the saphenous popliteal junction to the mid calf.  Diameters of the vein ranged from 6 to 8 mm. ? ? ? ? ?Deitra Mayo ?[facility] ?

## 2022-02-21 ENCOUNTER — Encounter: Payer: Self-pay | Admitting: Vascular Surgery

## 2022-02-21 ENCOUNTER — Ambulatory Visit: Payer: BC Managed Care – PPO | Admitting: Vascular Surgery

## 2022-02-21 VITALS — BP 135/82 | HR 65 | Temp 98.3°F | Resp 18 | Ht 68.5 in | Wt 155.4 lb

## 2022-02-21 DIAGNOSIS — I872 Venous insufficiency (chronic) (peripheral): Secondary | ICD-10-CM

## 2022-02-21 DIAGNOSIS — I83812 Varicose veins of left lower extremities with pain: Secondary | ICD-10-CM

## 2022-02-21 NOTE — Progress Notes (Signed)
REASON FOR VISIT:   Follow-up visit.  MEDICAL ISSUES:   CHRONIC VENOUS INSUFFICIENCY: This patient's venous ulcer has healed on the left leg.  She has been wearing her compression stockings and elevating her legs for an hour every day.  I do not think we need to address the small saphenous vein given that the wound has healed and I think given its proximity to the popliteal vein she would be at increased risk for DVT.  She has a vein of [name] with no connection to the popliteal vein however it runs very close to the deep system.  I have encouraged her to continue to elevate her legs.  She will continue to wear her compression stockings.  We have also discussed the importance of exercise and trying to avoid prolonged sitting and standing.  I will see her back as needed.  HPI:   Tracie Noble is a pleasant 66 y.o. female who I saw in consultation with varicose veins of the left leg on 10/13/2021.  She had CEAP C6 venous disease.  The wound she had had almost completely healed.  We discussed the importance of intermittent leg elevation and the proper positioning for this.  We had her fitted for a knee-high compression stocking with a gradient of 20 to 30 mmHg.  I encouraged her to wear these at work at all times.  We discussed the importance of exercise and trying to avoid prolonged sitting and standing.  I felt that if the ulcer did not heal or she had a recurrent ulcer she would be considered for laser ablation of the left small saphenous vein which was significantly dilated.  She did have a vein of [name] on the left.  We also discussed trying thigh-high compression stockings with a gradient of 20 to 30 mmHg.  Since I saw her last, the wound at her malleolus has completely healed.  She has been wearing her knee-high compression stockings.  She also elevates her legs for an hour at the end of each day.  Her job does require her to stand for a prolonged period time.  She denies significant  pain in her legs.  She was concerned about 1 area over the anterior lateral aspect of her left leg which was slightly tender.  Past Medical History:  Diagnosis Date   Cancer ([place])    cervical at age 3     Family History  Problem Relation Age of Onset   Dementia Mother    Cancer Father        lymph nodes, non hodgkins lymphoma   Diabetes Father     SOCIAL HISTORY: Social History   Tobacco Use   Smoking status: Former    Packs/day: 0.25    Years: 10.00    Total pack years: 2.50    Types: Cigarettes    Quit date: 09/06/2002    Years since quitting: 19.4   Smokeless tobacco: Never  Substance Use Topics   Alcohol use: No    No Known Allergies  Current Outpatient Medications  Medication Sig Dispense Refill   celecoxib (CELEBREX) 100 MG capsule Take 1 capsule up to twice daily as needed for pain (Patient not taking: Reported on 02/21/2022) 60 capsule 1   silver sulfADIAZINE (SSD) 1 % cream Apply 1 application. topically daily. (Patient not taking: Reported on 02/21/2022) 50 g 0   No current facility-administered medications for this visit.    REVIEW OF SYSTEMS:  '[X]'$  denotes positive finding, '[ ]'$  denotes  negative finding Cardiac  Comments:  Chest pain or chest pressure:    Shortness of breath upon exertion:    Short of breath when lying flat:    Irregular heart rhythm:        Vascular    Pain in calf, thigh, or hip brought on by ambulation:    Pain in feet at night that wakes you up from your sleep:     Blood clot in your veins:    Leg swelling:         Pulmonary    Oxygen at home:    Productive cough:     Wheezing:         Neurologic    Sudden weakness in arms or legs:     Sudden numbness in arms or legs:     Sudden onset of difficulty speaking or slurred speech:    Temporary loss of vision in one eye:     Problems with dizziness:         Gastrointestinal    Blood in stool:     Vomited blood:         Genitourinary    Burning when urinating:     Blood in  urine:        Psychiatric    Major depression:         Hematologic    Bleeding problems:    Problems with blood clotting too easily:        Skin    Rashes or ulcers:        Constitutional    Fever or chills:     PHYSICAL EXAM:   Vitals:   02/21/22 1019  BP: 135/82  Pulse: 65  Resp: 18  Temp: 98.3 F (36.8 C)  TempSrc: Temporal  SpO2: 99%  Weight: 155 lb 6.4 oz (70.5 kg)  Height: 5' 8.5" (1.74 m)    GENERAL: The patient is a well-nourished female, in no acute distress. The vital signs are documented above. CARDIAC: There is a regular rate and rhythm.  VASCULAR: She has palpable pedal pulses. The area over her anterior lateral left leg which she was concerned about is slightly indurated suggesting possibly some mild underlying phlebitis. The wound on her foot has healed.   I did look at her great saphenous vein myself with the SonoSite today.  This has not changed compared to her previous study in May.  The vein is not significantly dilated in the majority of the thigh.  The small saphenous vein is significantly dilated.  She has a vein of [name] and I do not see a connection with the popliteal vein.  However the vein runs very close to the popliteal vein which would put her at increased risk for DVT if she underwent laser ablation. PULMONARY: There is good air exchange bilaterally without wheezing or rales.  MUSCULOSKELETAL: There are no major deformities or cyanosis. NEUROLOGIC: No focal weakness or paresthesias are detected. SKIN: There are no ulcers or rashes noted. PSYCHIATRIC: The patient has a normal affect.  DATA:    VENOUS DUPLEX: I did review her previous venous duplex scan that was done back in May.  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  She had deep venous reflux involving the femoral vein, common femoral vein, and popliteal vein.  She had some scattered areas of reflux in the great saphenous vein.  She did have significant  reflux in the small saphenous vein which had diameters ranging from  6 to 8 mm.  She had a vein of [name].  Deitra Mayo [facility] [phone]

## 2022-04-17 ENCOUNTER — Ambulatory Visit: Payer: BC Managed Care – PPO | Admitting: Family Medicine

## 2022-04-19 ENCOUNTER — Telehealth: Payer: Self-pay

## 2022-04-19 NOTE — Telephone Encounter (Addendum)
Pt called stating her L foot is swollen and requested an appt with Dr. Scot Dock.  Reviewed pt's chart, returned call for clarification, no answer, lf vm.  Pt called again.  Called pt, two identifiers used. Pt stated that the top of her L foot and ankle are swollen, making it difficult to walk. Pt stated that the swelling started last week with no provocation that she's aware of. She has been using compressions, elevation, and exercise. Instructed her to use ice intermittently to see if that would help. Since Dr Scot Dock is out of the office for the next 2 weeks, an appt was made with the PA. Confirmed understanding.

## 2022-04-23 ENCOUNTER — Ambulatory Visit: Payer: BC Managed Care – PPO

## 2022-04-24 NOTE — Progress Notes (Unsigned)
Office Note     CC:  follow up Requesting Provider:  Janora Norlander, DO  HPI: Tracie Noble is a 66 y.o. ([birth year]) female who presents for follow up evaluation of left foot and ankle swelling. She has previously been evaluated by Dr. Scot Dock earlier this year with LLE ulceration. 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 She had deep venous reflux involving the femoral vein, common femoral vein, and popliteal vein.  he had some scattered areas of reflux in the great saphenous vein. She did have significant reflux in the small saphenous vein. Due to some of her duplex findings she was not felt to be good candidate for venous ablation. Dr. Scot Dock encouraged her to elevate and wear compression.   She returns with concerns of worsening swelling in her left foot and ankle. She says that it has not necessarily worsened but feels that it is not getting any better. She denies any aching, throbbing, stinging, burning, bleeding or recurrent ulceration. She is very diligent about daily walking, wearing her knee high 20-30 mmHg compression stockings and elevating. 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   Past Medical History:  Diagnosis Date   Cancer ([place])    cervical at age 66     Past Surgical History:  Procedure Laterality Date   ABDOMINAL HYSTERECTOMY     JOINT REPLACEMENT     left- leg    Social History   Socioeconomic History   Marital status: Married    Spouse name: Not on file   Number of children: Not on file   Years of education: Not on file   Highest education level: Not on file  Occupational History   Not on file  Tobacco Use   Smoking status: Former    Packs/day: 0.25    Years: 10.00    Total pack years: 2.50    Types: Cigarettes    Quit date: 09/06/2002    Years since quitting: 19.6   Smokeless tobacco: Never  Vaping Use   Vaping Use: Never used  Substance and Sexual Activity    Alcohol use: No   Drug use: No   Sexual activity: Yes    Comment: married 7 years, on daughter and grankids  Other Topics Concern   Not on file  Social History Narrative   Not on file   Social Determinants of Health   Financial Resource Strain: Not on file  Food Insecurity: Not on file  Transportation Needs: Not on file  Physical Activity: Not on file  Stress: Not on file  Social Connections: Not on file  Intimate Partner Violence: Not on file    Family History  Problem Relation Age of Onset   Dementia Mother    Cancer Father        lymph nodes, non hodgkins lymphoma   Diabetes Father     No current outpatient medications on file.   No current facility-administered medications for this visit.    No Known Allergies   REVIEW OF SYSTEMS:   '[X]'$  denotes positive finding, '[ ]'$  denotes negative finding Cardiac  Comments:  Chest pain or chest pressure:    Shortness of breath upon exertion:    Short of breath when lying flat:    Irregular heart rhythm:        Vascular    Pain in calf, thigh, or hip brought on by ambulation:    Pain in feet at night that  wakes you up from your sleep:     Blood clot in your veins:    Leg swelling:         Pulmonary    Oxygen at home:    Productive cough:     Wheezing:         Neurologic    Sudden weakness in arms or legs:     Sudden numbness in arms or legs:     Sudden onset of difficulty speaking or slurred speech:    Temporary loss of vision in one eye:     Problems with dizziness:         Gastrointestinal    Blood in stool:     Vomited blood:         Genitourinary    Burning when urinating:     Blood in urine:        Psychiatric    Major depression:         Hematologic    Bleeding problems:    Problems with blood clotting too easily:        Skin    Rashes or ulcers:        Constitutional    Fever or chills:      PHYSICAL EXAMINATION:  Vitals:   04/25/22 1253  BP: (!) 155/90  Pulse: 66  Temp: 97.7 F  (36.5 C)  TempSrc: Temporal  SpO2: 100%  Weight: 157 lb (71.2 kg)  Height: '5\' 11"'$  (1.803 m)    General:  WDWN in NAD; vital signs documented above Gait: Normal HENT: WNL, normocephalic , without Rales, rhonchi,  wheezing Cardiac: regular HR Vascular Exam/Pulses: 2+ DP pulse, 2+ PT pulse on left. Left leg well perfused and warm Extremities: without ischemic changes, without Gangrene , without cellulitis; without open wounds; 2+ pitting edema of left ankle  Musculoskeletal: no muscle wasting or atrophy  Neurologic: A&O X 3;  No focal weakness or paresthesias are detected Psychiatric:  The pt has Normal affect.   Non-Invasive Vascular Imaging:  10/13/21 +--------------+---------+------+-----------+------------+-------------+  LEFT         Reflux NoRefluxReflux TimeDiameter cmsComments                               Yes                                        +--------------+---------+------+-----------+------------+-------------+  CFV                    yes   >1 second                            +--------------+---------+------+-----------+------------+-------------+  FV mid                  yes   >1 second                            +--------------+---------+------+-----------+------------+-------------+  Popliteal              yes   >1 second                            +--------------+---------+------+-----------+------------+-------------+  GSV at [facility]  yes    >500 ms      0.32                   +--------------+---------+------+-----------+------------+-------------+  GSV prox thigh          yes    >500 ms      0.48                   +--------------+---------+------+-----------+------------+-------------+  GSV mid thigh no                            0.27    out of fascia  +--------------+---------+------+-----------+------------+-------------+  GSV dist thighno                            0.18                    +--------------+---------+------+-----------+------------+-------------+  GSV at knee             yes    >500 ms      0.20                   +--------------+---------+------+-----------+------------+-------------+  GSV prox calf no                            0.28                   +--------------+---------+------+-----------+------------+-------------+  GSV mid calf  no        yes    >500 ms      0.19                   +--------------+---------+------+-----------+------------+-------------+  GSV dist calf           yes    >500 ms      0.17                   +--------------+---------+------+-----------+------------+-------------+  SSV Pop Fossa           yes    >500 ms      0.64    distal thigh   +--------------+---------+------+-----------+------------+-------------+  SSV prox calf           yes    >500 ms      0.79                   +--------------+---------+------+-----------+------------+-------------+  SSV mid calf            yes    >500 ms      0.57                   +--------------+---------+------+-----------+------------+-------------+    ASSESSMENT/PLAN:: 66 y.o. female here for follow up for left ankle swelling. She has known chronic venous insufficiency. Her duplex from May of this year shows chronic non occlusive thrombus in CFV, deep reflux in CFV, FV, and popliteal vein as well as superficial venous reflux throughout GSV and SSV. However her vein is mostly very small. Dr. Scot Dock also mentions close proximity of her vein of [name] that would put her at high risk for DVT if she were to have any ablation. I reviewed this with her and have recommended continued conservative therapy - I reassured her that she is not at risk for limb loss - I discussed that she could come back  and have her duplex repeated but that Im not sure that it would change her treatment options  - She will follow up as needed if she has new or concerning  symptoms   Karoline Caldwell, PA-C [facility] [phone]  Clinic MD:   [facility]

## 2022-04-25 ENCOUNTER — Ambulatory Visit: Payer: BC Managed Care – PPO | Admitting: Physician Assistant

## 2022-04-25 VITALS — BP 155/90 | HR 66 | Temp 97.7°F | Ht 71.0 in | Wt 157.0 lb

## 2022-04-25 DIAGNOSIS — I83812 Varicose veins of left lower extremities with pain: Secondary | ICD-10-CM | POA: Diagnosis not present

## 2022-04-25 DIAGNOSIS — I872 Venous insufficiency (chronic) (peripheral): Secondary | ICD-10-CM | POA: Diagnosis not present

## 2023-04-11 ENCOUNTER — Encounter: Payer: Self-pay | Admitting: Nurse Practitioner

## 2023-04-11 ENCOUNTER — Ambulatory Visit (INDEPENDENT_AMBULATORY_CARE_PROVIDER_SITE_OTHER): Payer: Medicare Other | Admitting: Nurse Practitioner

## 2023-04-11 VITALS — BP 127/72 | HR 63 | Temp 97.7°F | Ht 71.0 in | Wt 153.8 lb

## 2023-04-11 DIAGNOSIS — R051 Acute cough: Secondary | ICD-10-CM | POA: Insufficient documentation

## 2023-04-11 DIAGNOSIS — J3489 Other specified disorders of nose and nasal sinuses: Secondary | ICD-10-CM | POA: Diagnosis not present

## 2023-04-11 DIAGNOSIS — J029 Acute pharyngitis, unspecified: Secondary | ICD-10-CM | POA: Insufficient documentation

## 2023-04-11 DIAGNOSIS — R0981 Nasal congestion: Secondary | ICD-10-CM | POA: Insufficient documentation

## 2023-04-11 LAB — VERITOR FLU A/B WAIVED
Influenza A: NEGATIVE
Influenza B: NEGATIVE

## 2023-04-11 LAB — RAPID STREP SCREEN (MED CTR MEBANE ONLY): Strep Gp A Ag, IA W/Reflex: NEGATIVE

## 2023-04-11 LAB — CULTURE, GROUP A STREP

## 2023-04-11 MED ORDER — FLUTICASONE PROPIONATE 50 MCG/ACT NA SUSP: 2.0000 | Freq: Every day | NASAL | 6 refills | Status: AC

## 2023-04-11 NOTE — Progress Notes (Signed)
Acute Office Visit  Subjective:     Patient ID: Tracie Noble, female    DOB: 1955-07-04, 67 y.o.   MRN: [id number]  Chief Complaint  Patient presents with   Cough    Productive cough since Sunday coughing up green sputum    Nasal Congestion   Sore Throat    HPI Cough: Patient complains of productive cough with sputum described as green, rhinorrhea  , and sore throat.  Symptoms began 4 days ago.  The cough is without wheezing, dyspnea or hemoptysis, productive of green/yellow sputum and is aggravated by nothing Associated symptoms include:postnasal drip. Patient does have new pets. Patient does not have a history of asthma. Patient does not have a history of environmental allergens. Patient does not have recent travel. Patient does not have a history of smoking. 'Been doing cough drops, with minimal releif" POC flu, and strep negative awaiting for COVID results.  Client is upset that she is not getting an antibiotic "my husband came here yesterday had the same symptoms they gave him antibiotic and a steroid injection why I am not getting the same thing". Explains to clinet that her sympstoms are mostly viral and doesn't require ATB.  Review of Systems  Constitutional:  Negative for chills and fever.  HENT:  Positive for sore throat.   Respiratory:  Positive for cough and sputum production. Negative for shortness of breath and wheezing.   Cardiovascular:  Negative for chest pain and leg swelling.  Gastrointestinal:  Negative for nausea and vomiting.  Neurological:  Negative for dizziness and headaches.  Endo/Heme/Allergies:  Negative for environmental allergies and polydipsia. Does not bruise/bleed easily.  Psychiatric/Behavioral:  Negative for suicidal ideas. The patient does not have insomnia.    Negative unless indicated in HPI    Objective:    BP 127/72   Pulse 63   Temp 97.7 F (36.5 C) (Temporal)   Ht 5\' 11"  (1.803 m)   Wt 153 lb 12.8 oz (69.8 kg)   SpO2 97%   BMI 21.45  kg/m  BP Readings from Last 3 Encounters:  04/11/23 127/72  04/25/22 (!) 155/90  02/21/22 135/82   Wt Readings from Last 3 Encounters:  04/11/23 153 lb 12.8 oz (69.8 kg)  04/25/22 157 lb (71.2 kg)  02/21/22 155 lb 6.4 oz (70.5 kg)      Physical Exam Vitals and nursing note reviewed.  Constitutional:      Appearance: She is well-developed.  HENT:     Head: Normocephalic and atraumatic.     Right Ear: Tympanic membrane and ear canal normal.     Left Ear: Tympanic membrane and ear canal normal.     Nose: Congestion and rhinorrhea present.  Eyes:     General: No scleral icterus.    Extraocular Movements: Extraocular movements intact.     Conjunctiva/sclera: Conjunctivae normal.     Pupils: Pupils are equal, round, and reactive to light.  Cardiovascular:     Rate and Rhythm: Normal rate and regular rhythm.  Pulmonary:     Effort: Pulmonary effort is normal.     Breath sounds: Normal breath sounds. No wheezing.  Skin:    General: Skin is warm and dry.     Findings: No rash.  Neurological:     Mental Status: She is alert and oriented to person, place, and time. Mental status is at baseline.  Psychiatric:        Mood and Affect: Mood normal.  Behavior: Behavior normal.        Thought Content: Thought content normal.        Judgment: Judgment normal.     No results found for any visits on 04/11/23.      Assessment & Plan:  Acute cough -     Novel Coronavirus, NAA ([facility]) -     Veritor Flu A/B Waived  Nasal congestion -     Novel Coronavirus, NAA ([facility]) -     Veritor Flu A/B Waived  Sore throat -     Rapid Strep Screen ([facility] ONLY)  Rhinorrhea -     Fluticasone Propionate; Place 2 sprays into both nostrils daily.  Dispense: 16 g; Refill: 6   Lakeria is a 67 year old female seen today for URI, no acute distress Rhinorrhea with congestion: Start Flonase And strep negative, awaiting for COVID results Client is instructed to get Coricidin  over-the-counter to help with the cough Tylenol or ibuprofen if develop a fever Increase hydration Return if symptoms worsen or fail to improve.     It appears that you have a viral upper respiratory infection (cold).  Cold symptoms can last up to 2 weeks.  I recommend that you only use cold medications that are safe in high blood pressure like Coricidin (generic is fine).  Other cold medications can increase your blood pressure.    - Get plenty of rest and drink plenty of fluids. - Try to breathe moist air. Use a cold mist humidifier. - Consume warm fluids (soup or tea) to provide relief for a stuffy nose and to loosen phlegm. - For nasal stuffiness, try saline nasal spray or a Neti Pot.  Afrin nasal spray can also be used but this product should not be used longer than 3 days or it will cause rebound nasal stuffiness (worsening nasal congestion). - For sore throat pain relief: use chloraseptic spray, suck on throat lozenges, hard candy or popsicles; gargle with warm salt water (1/4 tsp. salt per 8 oz. of water); and eat soft, bland foods. - Eat a well-balanced diet. If you cannot, ensure you are getting enough nutrients by taking a daily multivitamin. - Avoid dairy products, as they can thicken phlegm. - Avoid alcohol, as it impairs your body's immune system.  CONTACT YOUR DOCTOR IF YOU EXPERIENCE ANY OF THE FOLLOWING: - High fever - Ear pain - Sinus-type headache - Unusually severe cold symptoms - Cough that gets worse while other cold symptoms improve - Flare up of any chronic lung problem, such as asthma - Your symptoms persist longer than 2 weeks   Martina Sinner, DNP [facility] [address] [place], [place] [address] [phone]

## 2023-04-12 LAB — NOVEL CORONAVIRUS, NAA: SARS-CoV-2, NAA: NOT DETECTED

## 2023-06-21 ENCOUNTER — Encounter: Payer: Self-pay | Admitting: Family Medicine

## 2023-06-21 ENCOUNTER — Ambulatory Visit (INDEPENDENT_AMBULATORY_CARE_PROVIDER_SITE_OTHER): Payer: Medicare Other

## 2023-06-21 ENCOUNTER — Ambulatory Visit (INDEPENDENT_AMBULATORY_CARE_PROVIDER_SITE_OTHER): Payer: Medicare Other | Admitting: Family Medicine

## 2023-06-21 VITALS — BP 119/76 | HR 70 | Temp 98.7°F | Ht 71.0 in | Wt 153.0 lb

## 2023-06-21 DIAGNOSIS — L84 Corns and callosities: Secondary | ICD-10-CM

## 2023-06-21 DIAGNOSIS — Z0001 Encounter for general adult medical examination with abnormal findings: Secondary | ICD-10-CM | POA: Diagnosis not present

## 2023-06-21 DIAGNOSIS — Z780 Asymptomatic menopausal state: Secondary | ICD-10-CM

## 2023-06-21 DIAGNOSIS — Z1159 Encounter for screening for other viral diseases: Secondary | ICD-10-CM

## 2023-06-21 DIAGNOSIS — B351 Tinea unguium: Secondary | ICD-10-CM

## 2023-06-21 DIAGNOSIS — Z114 Encounter for screening for human immunodeficiency virus [HIV]: Secondary | ICD-10-CM

## 2023-06-21 DIAGNOSIS — Z0000 Encounter for general adult medical examination without abnormal findings: Secondary | ICD-10-CM

## 2023-06-21 DIAGNOSIS — Z1211 Encounter for screening for malignant neoplasm of colon: Secondary | ICD-10-CM

## 2023-06-21 DIAGNOSIS — L853 Xerosis cutis: Secondary | ICD-10-CM | POA: Diagnosis not present

## 2023-06-21 DIAGNOSIS — E7800 Pure hypercholesterolemia, unspecified: Secondary | ICD-10-CM

## 2023-06-21 NOTE — Patient Instructions (Signed)
Basic Skin Care Your skin plays an important role in keeping the entire body healthy.  Below are some tips on how to try and maximize skin health from the outside in.  Bathe in mildly warm water every 1 to 3 days, followed by light drying and an application of a thick moisturizer cream or ointment, preferably one that comes in a tub. Fragrance free moisturizing bars or body washes are preferred such as Purpose, Cetaphil, Dove sensitive skin, Aveeno, [place] Baby or Vanicream products. Use a fragrance free cream or ointment, not a lotion, such as plain petroleum jelly or Vaseline ointment, Aquaphor, Vanicream, Eucerin cream or a generic version, CeraVe Cream, Cetaphil Restoraderm, Aveeno Eczema Therapy and [place] Baby Calming, among others. Children with very dry skin often need to put on these creams two, three or four times a day.  As much as possible, use these creams enough to keep the skin from looking dry. Consider using fragrance free/dye free detergent, such as Arm and Hammer for sensitive skin, Tide Free or All Free.   If I am prescribing a medication to go on the skin, the medicine goes on first to the areas that need it, followed by a thick cream as above to the entire body.  Sun is a major cause of damage to the skin. I recommend sun protection for all of my patients. I prefer physical barriers such as hats with wide brims that cover the ears, long sleeve clothing with SPF protection including rash guards for swimming. These can be found seasonally at outdoor clothing companies, [employer] and [employer] and online at www.coolibar.com, www.uvskinz.com and www.sunprecautions.com. Avoid peak sun between the hours of 10am to 3pm to minimize sun exposure.  I recommend sunscreen for all of my patients older than 6 months of age when in the sun, preferably with broad spectrum coverage and SPF 30 or higher.  For children, I recommend sunscreens that only contain titanium dioxide and/or zinc oxide  in the active ingredients. These do not burn the eyes and appear to be safer than chemical sunscreens. These sunscreens include zinc oxide paste found in the diaper section, Vanicream Broad Spectrum 50+, Aveeno Natural Mineral Protection, Neutrogena Pure and Free Baby, Johnson and Johnson Baby Daily face and body lotion, [place] Baby products, among others. There is no such thing as waterproof sunscreen. All sunscreens should be reapplied after 60-80 minutes of wear.  Spray on sunscreens often use chemical sunscreens which do protect against the sun. However, these can be difficult to apply correctly, especially if wind is present, and can be more likely to irritate the skin.  Long term effects of chemical sunscreens are also not fully known.     

## 2023-06-21 NOTE — Progress Notes (Signed)
Subjective:    Tracie Noble is a 68 y.o. female who presents for a Welcome to Medicare exam.   Reports some dry skin on the eyelids and also has a spot on her foot that she would like me to look at.  She has plantar calluses and a nail that does not grow back on that left foot  Cardiac Risk Factors include: advanced age (>22men, >57 women);dyslipidemia      Objective:    Today's Vitals   06/21/23 1025  BP: 119/76  Pulse: 70  Temp: 98.7 F (37.1 C)  SpO2: 97%  Weight: 153 lb (69.4 kg)  Height: 5\' 11"  (1.803 m)  Body mass index is 21.34 kg/m.  Medications Outpatient Encounter Medications as of 06/21/2023  Medication Sig   fluticasone (FLONASE) 50 MCG/ACT nasal spray Place 2 sprays into both nostrils daily. (Patient not taking: Reported on 06/21/2023)   No facility-administered encounter medications on file as of 06/21/2023.     History: Past Medical History:  Diagnosis Date   Cancer (HCC)    cervical at age 39    Hyperlipidemia    Past Surgical History:  Procedure Laterality Date   ABDOMINAL HYSTERECTOMY     JOINT REPLACEMENT     left- leg    Family History  Problem Relation Age of Onset   Dementia Mother    Cancer Father        lymph nodes, non hodgkins lymphoma   Diabetes Father    Social History   Occupational History   Not on file  Tobacco Use   Smoking status: Former    Current packs/day: 0.00    Average packs/day: 0.3 packs/day for 10.0 years (2.5 ttl pk-yrs)    Types: Cigarettes    Start date: 09/05/1992    Quit date: 09/06/2002    Years since quitting: 20.8    Passive exposure: Past   Smokeless tobacco: Never  Vaping Use   Vaping status: Never Used  Substance and Sexual Activity   Alcohol use: No   Drug use: No   Sexual activity: Yes    Comment: married 7 years, on daughter and grankids    Tobacco Counseling Counseling given: Yes   Immunizations and Health Maintenance Immunization History  Administered Date(s) Administered   Tdap  08/30/2017   Health Maintenance Due  Topic Date Due   Hepatitis C Screening  Never done   Cervical Cancer Screening (HPV/Pap Cotest)  08/31/2022   DEXA SCAN  Never done    Activities of Daily Living    06/21/2023   10:34 AM  In your present state of health, do you have any difficulty performing the following activities:  Hearing? 0  Vision? 0  Difficulty concentrating or making decisions? 0  Walking or climbing stairs? 0  Dressing or bathing? 0  Doing errands, shopping? 0  Preparing Food and eating ? N  Using the Toilet? N  In the past six months, have you accidently leaked urine? N  Do you have problems with loss of bowel control? N  Managing your Medications? N  Managing your Finances? N  Housekeeping or managing your Housekeeping? N    Physical Exam   Physical Exam  General: Well-appearing female no acute distress Cardiac: Regular rate and rhythm.  S1, S2 heard.  No murmurs Pulmonary: Clear to auscultation bilaterally with no wheezes or rhonchi or rails.  Normal work of breathing on room air HEENT: Mildly dry skin noted along the upper eyelids bilaterally.  No associated  erythema, induration or exudates Extremities: Warm.  She has onychomycotic changes to the left great toenail with about half of it missing.  She has plantar calluses along the first and fifth MTPs of the left foot.  Advanced Directives: Does Patient Have a Medical Advance Directive?: Yes, No Does patient want to make changes to medical advance directive?: Yes (Inpatient - patient requests chaplain consult to change a medical advance directive) Would patient like information on creating a medical advance directive?: Yes (Inpatient - patient defers creating a medical advance directive at this time - Information given), Yes (Inpatient - patient requests chaplain consult to create a medical advance directive)  EKG:  normal EKG, normal sinus rhythm      Assessment:    This is a routine wellness  examination for this patient .   Vision/Hearing screen No results found.   Goals      Exercise 150 min/wk Moderate Activity        Depression Screen    06/21/2023   10:25 AM 04/11/2023    8:46 AM 09/26/2021    1:36 PM 09/12/2021   12:58 PM  PHQ 2/9 Scores  PHQ - 2 Score 0 0 0 0  PHQ- 9 Score 0 0 0 0     Fall Risk    06/21/2023   10:25 AM  Fall Risk   Falls in the past year? 0  Number falls in past yr: 0  Injury with Fall? 0  Risk for fall due to : No Fall Risks  Follow up Falls evaluation completed    Cognitive Function:    06/21/2023   10:31 AM  MMSE - Mini Mental State Exam  Orientation to time 5  Orientation to Place 5  Registration 3  Attention/ Calculation 5  Recall 3  Language- name 2 objects 2  Language- repeat 1  Language- follow 3 step command 3  Language- read & follow direction 1  Write a sentence 1  Copy design 1  Total score 30        06/21/2023   10:32 AM  6CIT Screen  What Year? 0 points  What month? 0 points  What time? 0 points  Count back from 20 0 points  Months in reverse 0 points  Repeat phrase 0 points  Total Score 0 points    Patient Care Team: [name], DO as PCP - General (Family Medicine)     Plan:    Welcome to Medicare preventive visit - Plan: EKG 12-Lead  Foot callus - Plan: Ambulatory referral to Podiatry  Onychomycosis - Plan: Ambulatory referral to Podiatry  Dry skin  Screen for colon cancer - Plan: CBC, Cologuard  Asymptomatic postmenopausal estrogen deficiency - Plan: DG [facility] DEXA, CBC  Pure hypercholesterolemia - Plan: CMP14+EGFR, Lipid Panel  Screening for HIV without presence of risk factors - Plan: HIV antibody (with reflex)  Encounter for hepatitis C screening test for low risk patient - Plan: Hepatitis C antibody   I have personally reviewed and noted the following in the patient's chart:   Medical and social history Use of alcohol, tobacco or illicit drugs  Current medications  and supplements Functional ability and status Nutritional status Physical activity Advanced directives List of other physicians Hospitalizations, surgeries, and ER visits in previous 12 months Vitals Screenings to include cognitive, depression, and falls Referrals and appointments  In addition, I have reviewed and discussed with patient certain preventive protocols, quality metrics, and best practice recommendations. A written personalized care plan  for preventive services as well as general preventive health recommendations were provided to patient.     Delynn Flavin, DO 06/21/2023

## 2023-06-22 LAB — CMP14+EGFR
ALT: 17 [IU]/L (ref 0–32)
AST: 21 [IU]/L (ref 0–40)
Albumin: 4.5 g/dL (ref 3.9–4.9)
Alkaline Phosphatase: 88 [IU]/L (ref 44–121)
BUN/Creatinine Ratio: 14 (ref 12–28)
BUN: 11 mg/dL (ref 8–27)
Bilirubin Total: 0.4 mg/dL (ref 0.0–1.2)
CO2: 26 mmol/L (ref 20–29)
Calcium: 9.9 mg/dL (ref 8.7–10.3)
Chloride: 102 mmol/L (ref 96–106)
Creatinine, Ser: 0.78 mg/dL (ref 0.57–1.00)
Globulin, Total: 2.4 g/dL (ref 1.5–4.5)
Glucose: 81 mg/dL (ref 70–99)
Potassium: 4.8 mmol/L (ref 3.5–5.2)
Sodium: 142 mmol/L (ref 134–144)
Total Protein: 6.9 g/dL (ref 6.0–8.5)
eGFR: 84 mL/min/{1.73_m2} (ref >59)

## 2023-06-22 LAB — CBC
Hematocrit: 40.2 % (ref 34.0–46.6)
Hemoglobin: 13.2 g/dL (ref 11.1–15.9)
MCH: 29.8 pg (ref 26.6–33.0)
MCHC: 32.8 g/dL (ref 31.5–35.7)
MCV: 91 fL (ref 79–97)
Platelets: 202 10*3/uL (ref 150–450)
RBC: 4.43 x10E6/uL (ref 3.77–5.28)
RDW: 12.6 % (ref 11.7–15.4)
WBC: 4.8 10*3/uL (ref 3.4–10.8)

## 2023-06-22 LAB — LIPID PANEL
Chol/HDL Ratio: 2.8 {ratio} (ref 0.0–4.4)
Cholesterol, Total: 219 mg/dL — ABNORMAL HIGH (ref 100–199)
HDL: 77 mg/dL (ref >39)
LDL Chol Calc (NIH): 127 mg/dL — ABNORMAL HIGH (ref 0–99)
Triglycerides: 83 mg/dL (ref 0–149)
VLDL Cholesterol Cal: 15 mg/dL (ref 5–40)

## 2023-06-22 LAB — HIV ANTIBODY (ROUTINE TESTING W REFLEX)

## 2023-06-22 LAB — HEPATITIS C ANTIBODY

## 2023-06-24 ENCOUNTER — Other Ambulatory Visit: Payer: Self-pay

## 2023-06-24 ENCOUNTER — Encounter: Payer: Self-pay | Admitting: Family Medicine

## 2023-06-24 DIAGNOSIS — E559 Vitamin D deficiency, unspecified: Secondary | ICD-10-CM

## 2023-06-24 DIAGNOSIS — M8580 Other specified disorders of bone density and structure, unspecified site: Secondary | ICD-10-CM | POA: Insufficient documentation

## 2023-06-26 ENCOUNTER — Other Ambulatory Visit: Payer: Medicare Other

## 2023-06-26 DIAGNOSIS — E559 Vitamin D deficiency, unspecified: Secondary | ICD-10-CM

## 2023-06-26 DIAGNOSIS — M8580 Other specified disorders of bone density and structure, unspecified site: Secondary | ICD-10-CM

## 2023-06-27 LAB — VITAMIN D 25 HYDROXY (VIT D DEFICIENCY, FRACTURES): Vit D, 25-Hydroxy: 32.4 ng/mL (ref 30.0–100.0)

## 2023-06-28 ENCOUNTER — Encounter: Payer: Self-pay | Admitting: Family Medicine

## 2023-07-03 ENCOUNTER — Encounter: Payer: Self-pay | Admitting: Podiatry

## 2023-07-03 ENCOUNTER — Ambulatory Visit (INDEPENDENT_AMBULATORY_CARE_PROVIDER_SITE_OTHER): Payer: Medicare Other | Admitting: Podiatry

## 2023-07-03 DIAGNOSIS — Q828 Other specified congenital malformations of skin: Secondary | ICD-10-CM | POA: Diagnosis not present

## 2023-07-03 LAB — COLOGUARD: COLOGUARD: NEGATIVE

## 2023-07-03 NOTE — Progress Notes (Signed)
  Subjective:  Patient ID: Tracie Noble, female    DOB: April 07, 1956,  MRN: [id number]  Chief Complaint  Patient presents with   Callouses    RM#13 Callus of both feet causing discomfort.    68 y.o. female presents with the above complaint.  Patient presents with left submetatarsal 5 porokeratotic lesion painful to touch is progressive gotten worse worse with ambulation worse with pressure she has not seen MRIs prior to seeing me pain scale 7 out of 10 dull aching nature would like to discuss treatment options for this.   Review of Systems: Negative except as noted in the HPI. Denies N/V/F/Ch.  Past Medical History:  Diagnosis Date   Cancer (HCC)    cervical at age 77    Hyperlipidemia     Current Outpatient Medications:    fluticasone (FLONASE) 50 MCG/ACT nasal spray, Place 2 sprays into both nostrils daily., Disp: 16 g, Rfl: 6  Social History   Tobacco Use  Smoking Status Former   Current packs/day: 0.00   Average packs/day: 0.3 packs/day for 10.0 years (2.5 ttl pk-yrs)   Types: Cigarettes   Start date: 09/05/1992   Quit date: 09/06/2002   Years since quitting: 20.8   Passive exposure: Past  Smokeless Tobacco Never    No Known Allergies Objective:  There were no vitals filed for this visit. There is no height or weight on file to calculate BMI. Constitutional Well developed. Well nourished.  Vascular Dorsalis pedis pulses palpable bilaterally. Posterior tibial pulses palpable bilaterally. Capillary refill normal to all digits.  No cyanosis or clubbing noted. Pedal hair growth normal.  Neurologic Normal speech. Oriented to person, place, and time. Epicritic sensation to light touch grossly present bilaterally.  Dermatologic Left submetatarsal 5 porokeratotic lesion with central nucleated core noted plantarflexed fifth metatarsal noted.  Orthopedic: Normal joint ROM without pain or crepitus bilaterally. No visible deformities. No bony tenderness.   Radiographs:  None Assessment:   1. Porokeratosis    Plan:  Patient was evaluated and treated and all questions answered.  Left submetatarsal 5 porokeratotic lesion -All questions and concerns were discussed with the patient in extensive detail -I discussed with her that given the amount of pain that she is having she will benefit from aggressive debridement of the lesion with central nucleated core.  Using chisel blade handle the lesion was debride down to healthy dry tissue no complication noted no pinpoint bleeding noted.  No follow-ups on file.

## 2023-07-24 ENCOUNTER — Other Ambulatory Visit: Payer: Self-pay | Admitting: Family Medicine

## 2023-07-24 DIAGNOSIS — Z1231 Encounter for screening mammogram for malignant neoplasm of breast: Secondary | ICD-10-CM

## 2023-07-31 ENCOUNTER — Ambulatory Visit
Admission: RE | Admit: 2023-07-31 | Discharge: 2023-07-31 | Disposition: A | Discharge status: Home / Self Care | Payer: Medicare Other | Source: Ambulatory Visit | Attending: Family Medicine

## 2023-07-31 DIAGNOSIS — Z1231 Encounter for screening mammogram for malignant neoplasm of breast: Secondary | ICD-10-CM

## 2023-08-07 ENCOUNTER — Telehealth: Payer: Self-pay

## 2023-08-07 NOTE — Telephone Encounter (Signed)
 I have tried to reach out to pt to see what is needed , no answer will follow up tomorrow

## 2023-08-07 NOTE — Telephone Encounter (Signed)
 Tracie Noble, I'm Dr Delynn Flavin, Thayer Jew is the nurse.  I will cc this to her as there appears to be some confusion.

## 2023-08-07 NOTE — Telephone Encounter (Signed)
 Copied from CRM [id number]. Topic: General - Other >> Aug 07, 2023 11:03 AM Abundio Miu S wrote: Reason for [id number] to speak with Adelina Mings in regard to an "issue with her foot". Requesting a callback and states she takes lunch at 1:30 pm.

## 2023-08-12 NOTE — Telephone Encounter (Signed)
 3rd time attempting to call pt , no answer left vm - closing call  If pt calls back please get information on what she is needing and send [place] a message

## 2023-08-16 ENCOUNTER — Ambulatory Visit (INDEPENDENT_AMBULATORY_CARE_PROVIDER_SITE_OTHER)

## 2023-08-16 ENCOUNTER — Telehealth: Payer: Self-pay | Admitting: Family Medicine

## 2023-08-16 ENCOUNTER — Telehealth: Payer: Self-pay | Admitting: *Deleted

## 2023-08-16 ENCOUNTER — Encounter: Payer: Self-pay | Admitting: Family Medicine

## 2023-08-16 VITALS — BP 123/80 | HR 97 | Temp 98.7°F | Ht 71.0 in | Wt 154.2 lb

## 2023-08-16 DIAGNOSIS — L97321 Non-pressure chronic ulcer of left ankle limited to breakdown of skin: Secondary | ICD-10-CM

## 2023-08-16 DIAGNOSIS — I83023 Varicose veins of left lower extremity with ulcer of ankle: Secondary | ICD-10-CM | POA: Diagnosis not present

## 2023-08-16 MED ORDER — DOXYCYCLINE HYCLATE 100 MG PO TABS: 100.0000 mg | ORAL_TABLET | Freq: Two times a day (BID) | ORAL | 0 refills | Status: DC

## 2023-08-16 NOTE — Telephone Encounter (Signed)
 Triage call regarding new ulcer. Pt was last seen in 2023. With it being over a year we would need a new referral. Asked pt to call her PCP and ask for them to send [place] a new referral.

## 2023-08-16 NOTE — Addendum Note (Signed)
 Addended by: [name] on: 08/16/2023 03:17 PM   Modules accepted: Orders

## 2023-08-16 NOTE — Patient Instructions (Signed)
 Tracie Noble

## 2023-08-16 NOTE — Telephone Encounter (Signed)
 Copied from CRM [id number]. Topic: Referral - Request for Referral >> Aug 16, 2023 11:57 AM Everette C wrote: Did the patient discuss referral with their provider in the last year? Yes (If No - schedule appointment) (If Yes - send message)  Appointment offered? No  Type of order/referral and detailed reason for visit: Vein and Vascular Specialist   Preference of office, provider, location: Patient has no preference within the office [facility]  The patient was provided the following numbers for their referral to be submitted to  Phone [phone] Fax [phone]   If referral order, have you been seen by this specialty before? Yes (If Yes, this issue or another issue? When? Where? Dr. Earlie Counts, 09/23 approx  Can we respond through MyChart? No

## 2023-08-16 NOTE — Progress Notes (Addendum)
 Subjective: CC: Skin lesion PCP: [name], DO Tracie Noble is a 68 y.o. female presenting to clinic today for:  1.  Skin lesion Patient reports that she has had a skin lesion on the left lateral ankle for almost 2 months now.  She saw the podiatrist and had the callus that was on the left plantar surface of the foot corrected.  When she asked him about the lateral ankle he thought that it looked okay.  She notes that she has had ongoing tenderness and inflammation in this area.  She has been applying Neosporin in efforts to heal it but it just is not healing.  Has history of ulcer and used to be under the care of Dr. Edilia Bo with vascular.  Has not seen him a couple of years ago   ROS: Per HPI  No Known Allergies Past Medical History:  Diagnosis Date   Cancer (HCC)    cervical at age 87    Hyperlipidemia     Current Outpatient Medications:    doxycycline (VIBRA-TABS) 100 MG tablet, Take 1 tablet (100 mg total) by mouth 2 (two) times daily for 7 days., Disp: 14 tablet, Rfl: 0   fluticasone (FLONASE) 50 MCG/ACT nasal spray, Place 2 sprays into both nostrils daily., Disp: 16 g, Rfl: 6 Social History   Socioeconomic History   Marital status: Married    Spouse name: Not on file   Number of children: Not on file   Years of education: Not on file   Highest education level: Not on file  Occupational History   Not on file  Tobacco Use   Smoking status: Former    Current packs/day: 0.00    Average packs/day: 0.3 packs/day for 10.0 years (2.5 ttl pk-yrs)    Types: Cigarettes    Start date: 09/05/1992    Quit date: 09/06/2002    Years since quitting: 20.9    Passive exposure: Past   Smokeless tobacco: Never  Vaping Use   Vaping status: Never Used  Substance and Sexual Activity   Alcohol use: No   Drug use: No   Sexual activity: Yes    Comment: married 7 years, on daughter and grankids  Other Topics Concern   Not on file  Social History Narrative   Not on file    Social Drivers of Health   Financial Resource Strain: Low Risk  (06/21/2023)   Overall Financial Resource Strain (CARDIA)    Difficulty of Paying Living Expenses: Not hard at all  Food Insecurity: No Food Insecurity (06/21/2023)   Hunger Vital Sign    Worried About Running Out of Food in the Last Year: Never true    Ran Out of Food in the Last Year: Never true  Transportation Needs: No Transportation Needs (06/21/2023)   PRAPARE - Administrator, Civil Service (Medical): No    Lack of Transportation (Non-Medical): No  Physical Activity: Sufficiently Active (06/21/2023)   Exercise Vital Sign    Days of Exercise per Week: 7 days    Minutes of Exercise per Session: 40 min  Recent Concern: Physical Activity - Insufficiently Active (06/21/2023)   Exercise Vital Sign    Days of Exercise per Week: 2 days    Minutes of Exercise per Session: 30 min  Stress: No Stress Concern Present (06/21/2023)   [employer] of Occupational Health - Occupational Stress Questionnaire    Feeling of Stress : Not at all  Social Connections: Moderately Isolated (06/21/2023)  Social Advertising account executive [NHANES]    Frequency of Communication with Friends and Family: Twice a week    Frequency of Social Gatherings with Friends and Family: Twice a week    Attends Religious Services: Never    Database administrator or Organizations: No    Attends Banker Meetings: Never    Marital Status: Married  Catering manager Violence: Not At Risk (06/21/2023)   Humiliation, Afraid, Rape, and Kick questionnaire    Fear of Current or Ex-Partner: No    Emotionally Abused: No    Physically Abused: No    Sexually Abused: No   Family History  Problem Relation Age of Onset   Dementia Mother    Cancer Father        lymph nodes, non hodgkins lymphoma   Diabetes Father    Breast cancer Neg Hx     Objective: Office vital signs reviewed. BP 123/80   Pulse 97   Temp 98.7 F (37.1 C)    Ht 5\' 11"  (1.803 m)   Wt 154 lb 3.2 oz (69.9 kg)   SpO2 97%   BMI 21.51 kg/m   Physical Examination:  General: Awake, alert, well nourished, No acute distress Skin: shallow ulcer along left lower ankle. No exudates. Minimal surrounding erythema, tenderness to palpation PRESENT      Assessment/ Plan: 68 y.o. 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  We discussed that this could be confounded by utilization of Neosporin.  Advised to discontinue use.  May use topical Vaseline and cover with bandage if needed.  Recommend referral back to her vascular specialist as this seems to be an ulcer related to the varicose veins that are near it.   [name], DO [facility] [phone]

## 2023-08-16 NOTE — Telephone Encounter (Signed)
 done

## 2023-08-27 ENCOUNTER — Other Ambulatory Visit: Payer: Self-pay | Admitting: *Deleted

## 2023-08-27 DIAGNOSIS — I872 Venous insufficiency (chronic) (peripheral): Secondary | ICD-10-CM

## 2023-08-28 ENCOUNTER — Ambulatory Visit (HOSPITAL_COMMUNITY)
Admission: RE | Admit: 2023-08-28 | Discharge: 2023-08-28 | Disposition: A | Discharge status: Home / Self Care | Source: Ambulatory Visit | Attending: Vascular Surgery | Admitting: Vascular Surgery

## 2023-08-28 DIAGNOSIS — I872 Venous insufficiency (chronic) (peripheral): Secondary | ICD-10-CM | POA: Insufficient documentation

## 2023-09-10 ENCOUNTER — Ambulatory Visit (INDEPENDENT_AMBULATORY_CARE_PROVIDER_SITE_OTHER): Admitting: Physician Assistant

## 2023-09-10 VITALS — BP 171/94 | HR 64 | Temp 98.2°F | Resp 18 | Ht 72.0 in | Wt 156.7 lb

## 2023-09-10 DIAGNOSIS — I872 Venous insufficiency (chronic) (peripheral): Secondary | ICD-10-CM | POA: Diagnosis not present

## 2023-09-10 DIAGNOSIS — I83812 Varicose veins of left lower extremities with pain: Secondary | ICD-10-CM | POA: Diagnosis not present

## 2023-09-10 NOTE — Progress Notes (Signed)
 Office Note     CC:  follow up Requesting Provider:  [name], DO  HPI: Tracie Noble is a 68 y.o. (1956-04-24) female who presents for evaluation of left lower extremity edema.  She was last seen in the office in 2023 in regards to her left lower extremity edema.  At the time she healed a left medial lower leg venous ulceration with wound care.  She has a history of left common femoral vein DVT.  She has known reflux in the greater saphenous vein as well as the small saphenous vein.  Dr. Edilia Bo mentioned that she had a vein of [name] with no connection to the popliteal vein however the small saphenous vein runs close to the deep system.    She presents today with a small wound on her lateral foot.  She believes this is healing well with local wound care and continued use of compression.  She wears knee-high compression on a daily basis.  She is not concerned about infection.  She also elevates her legs periodically during the day when possible.  She is worried about additional blood clots.  Past Medical History:  Diagnosis Date   Cancer (HCC)    cervical at age 14    Hyperlipidemia     Past Surgical History:  Procedure Laterality Date   ABDOMINAL HYSTERECTOMY     JOINT REPLACEMENT     left- leg    Social History   Socioeconomic History   Marital status: Married    Spouse name: Not on file   Number of children: Not on file   Years of education: Not on file   Highest education level: Not on file  Occupational History   Not on file  Tobacco Use   Smoking status: Former    Current packs/day: 0.00    Average packs/day: 0.3 packs/day for 10.0 years (2.5 ttl pk-yrs)    Types: Cigarettes    Start date: 09/05/1992    Quit date: 09/06/2002    Years since quitting: 21.0    Passive exposure: Past   Smokeless tobacco: Never  Vaping Use   Vaping status: Never Used  Substance and Sexual Activity   Alcohol use: No   Drug use: No   Sexual activity: Yes    Comment:  married 7 years, on daughter and grankids  Other Topics Concern   Not on file  Social History Narrative   Not on file   Social Drivers of Health   Financial Resource Strain: Low Risk  (06/21/2023)   Overall Financial Resource Strain (CARDIA)    Difficulty of Paying Living Expenses: Not hard at all  Food Insecurity: No Food Insecurity (06/21/2023)   Hunger Vital Sign    Worried About Running Out of Food in the Last Year: Never true    Ran Out of Food in the Last Year: Never true  Transportation Needs: No Transportation Needs (06/21/2023)   PRAPARE - Administrator, Civil Service (Medical): No    Lack of Transportation (Non-Medical): No  Physical Activity: Sufficiently Active (06/21/2023)   Exercise Vital Sign    Days of Exercise per Week: 7 days    Minutes of Exercise per Session: 40 min  Recent Concern: Physical Activity - Insufficiently Active (06/21/2023)   Exercise Vital Sign    Days of Exercise per Week: 2 days    Minutes of Exercise per Session: 30 min  Stress: No Stress Concern Present (06/21/2023)   [employer] of Occupational Health -  Occupational Stress Questionnaire    Feeling of Stress : Not at all  Social Connections: Moderately Isolated (06/21/2023)   Social Connection and Isolation Panel [NHANES]    Frequency of Communication with Friends and Family: Twice a week    Frequency of Social Gatherings with Friends and Family: Twice a week    Attends Religious Services: Never    Database administrator or Organizations: No    Attends Banker Meetings: Never    Marital Status: Married  Catering manager Violence: Not At Risk (06/21/2023)   Humiliation, Afraid, Rape, and Kick questionnaire    Fear of Current or Ex-Partner: No    Emotionally Abused: No    Physically Abused: No    Sexually Abused: No    Family History  Problem Relation Age of Onset   Dementia Mother    Cancer Father        lymph nodes, non hodgkins lymphoma   Diabetes Father     Breast cancer Neg Hx     Current Outpatient Medications  Medication Sig Dispense Refill   fluticasone (FLONASE) 50 MCG/ACT nasal spray Place 2 sprays into both nostrils daily. 16 g 6   No current facility-administered medications for this visit.    No Known Allergies   REVIEW OF SYSTEMS:   [X]  denotes positive finding, [ ]  denotes negative finding Cardiac  Comments:  Chest pain or chest pressure:    Shortness of breath upon exertion:    Short of breath when lying flat:    Irregular heart rhythm:        Vascular    Pain in calf, thigh, or hip brought on by ambulation:    Pain in feet at night that wakes you up from your sleep:     Blood clot in your veins:    Leg swelling:         Pulmonary    Oxygen at home:    Productive cough:     Wheezing:         Neurologic    Sudden weakness in arms or legs:     Sudden numbness in arms or legs:     Sudden onset of difficulty speaking or slurred speech:    Temporary loss of vision in one eye:     Problems with dizziness:         Gastrointestinal    Blood in stool:     Vomited blood:         Genitourinary    Burning when urinating:     Blood in urine:        Psychiatric    Major depression:         Hematologic    Bleeding problems:    Problems with blood clotting too easily:        Skin    Rashes or ulcers:        Constitutional    Fever or chills:      PHYSICAL EXAMINATION:  Vitals:   09/10/23 0846  BP: (!) 171/94  Pulse: 64  Resp: 18  Temp: 98.2 F (36.8 C)  TempSrc: Temporal  SpO2: 98%  Weight: 156 lb 11.2 oz (71.1 kg)  Height: 6' (1.829 m)    General:  WDWN in NAD; vital signs documented above Gait: Not observed HENT: WNL, normocephalic Pulmonary: normal non-labored breathing , without Rales, rhonchi,  wheezing Cardiac: regular HR Abdomen: soft, NT, no masses Skin: without rashes Vascular Exam/Pulses: palpable DP pulses Extremities: Prominent superficial  veins of the left leg with  varicosities of the medial lower leg; small wound pictured below Musculoskeletal: no muscle wasting or atrophy  Neurologic: A&O X 3 Psychiatric:  The pt has Normal affect.    Non-Invasive Vascular Imaging:   LEFT          Reflux NoRefluxReflux TimeDiameter cmsComments                               Yes                                        +--------------+---------+------+-----------+------------+-------------+  CFV                    yes   >1 second                            +--------------+---------+------+-----------+------------+-------------+  FV mid                  yes   >1 second                            +--------------+---------+------+-----------+------------+-------------+  Popliteal              yes   >1 second                            +--------------+---------+------+-----------+------------+-------------+  GSV at SFJ              yes    >500 ms      .57                    +--------------+---------+------+-----------+------------+-------------+  GSV prox thigh          yes    >500 ms      .44                    +--------------+---------+------+-----------+------------+-------------+  GSV mid thigh           yes    >500 ms      .42                    +--------------+---------+------+-----------+------------+-------------+  GSV dist thighno                            .20     out of fascia  +--------------+---------+------+-----------+------------+-------------+  GSV at knee   no                            .22                    +--------------+---------+------+-----------+------------+-------------+  GSV prox calf                  >500 ms      .24     out of fascia  +--------------+---------+------+-----------+------------+-------------+  GSV mid calf                   >500 ms      .18                    +--------------+---------+------+-----------+------------+-------------+  SSV Pop  Fossa           yes    >500 ms      .76                    +--------------+---------+------+-----------+------------+-------------+  SSV prox calf           yes    >500 ms      .24                       ASSESSMENT/PLAN:: 68 y.o. female here for follow up for evaluation of left lateral foot wound with known venous insufficiency  Ms. Kearley is a 68 year old female who has been previously seen in this office for evaluation of left lower extremity venous insufficiency.  She has a history of left common femoral vein DVT as well as left medial lower leg venous ulcer which healed with wound care.  She has a known variant to her superficial venous system.  Her small saphenous vein does not join the popliteal vein rather runs closely alongside the deep system.  She was felt to be at high risk for DVT with a small saphenous vein ablation.    She believes her current lateral foot wound is healing well.  On exam there is a small eschar in this area with no sign of infection.  Her greater saphenous vein appears to have dilated some over the past couple years.  She may be a candidate for left greater saphenous vein ablation in the future.  She does not have any evidence of acute DVT.  She can continue conservative management with knee-high compression socks on a daily basis.  She can continue her current wound care to her left lateral foot wound.  She will also focus on periodic elevation of her leg above the level of her heart during the day.  She will notify our office if her wound worsens or fails to heal and we will schedule follow-up with a vein surgeon.  For now she can follow-up on an as-needed basis.   Emilie Rutter, PA-C [facility] [phone]  Clinic MD:   Chestine Spore

## 2023-09-20 ENCOUNTER — Encounter (HOSPITAL_COMMUNITY)

## 2023-09-25 ENCOUNTER — Telehealth: Payer: Self-pay

## 2023-09-25 ENCOUNTER — Ambulatory Visit

## 2023-09-25 NOTE — Telephone Encounter (Signed)
 Advice:  -pt called to report pain in her heel last night.   -pt reports she has a small wound that she has been cleaning with antiseptic and using silver  sulfadiazine  cream on it and has been slowly getting better and believes its a little better than when she was in the our office a couple of weeks ago.  She denies worsening swelling, redness, warmth or recent trauma. -consulted with provider in the office who does not think it is related to her venous insufficiency that she may want to speak with her PCP to evaluate.

## 2024-06-22 ENCOUNTER — Ambulatory Visit (INDEPENDENT_AMBULATORY_CARE_PROVIDER_SITE_OTHER): Payer: Medicare Other | Admitting: Family Medicine

## 2024-06-22 ENCOUNTER — Encounter: Payer: Self-pay | Admitting: Family Medicine

## 2024-06-22 VITALS — BP 131/74 | HR 68 | Temp 97.7°F | Ht 71.0 in | Wt 162.4 lb

## 2024-06-22 DIAGNOSIS — M8580 Other specified disorders of bone density and structure, unspecified site: Secondary | ICD-10-CM | POA: Diagnosis not present

## 2024-06-22 DIAGNOSIS — Z23 Encounter for immunization: Secondary | ICD-10-CM

## 2024-06-22 DIAGNOSIS — E559 Vitamin D deficiency, unspecified: Secondary | ICD-10-CM

## 2024-06-22 DIAGNOSIS — L853 Xerosis cutis: Secondary | ICD-10-CM

## 2024-06-22 DIAGNOSIS — E7800 Pure hypercholesterolemia, unspecified: Secondary | ICD-10-CM

## 2024-06-22 DIAGNOSIS — Z0000 Encounter for general adult medical examination without abnormal findings: Secondary | ICD-10-CM

## 2024-06-22 LAB — LIPID PANEL

## 2024-06-22 NOTE — Progress Notes (Signed)
 "  Tracie Noble is a 69 y.o. female presents to office today for annual physical exam examination.    She reports that she is doing well.  She is now retired.  She continues to exercise regularly, with a walk every morning, usually couple of hours of pickleball and often her recumbent bike at home.  She follows a fairly balanced diet.  She reports no concerns today except for some dry skin.  She has been utilizing a regular moisturizer on the face but it does not seem to be helping.  She washes with plain soap.  She does drink plenty of water  Occupation: retired, Marital status: married, Substance use: former smoker Health Maintenance Due  Topic Date Due   Medicare Annual Wellness (AWV)  06/20/2024    Immunization History  Administered Date(s) Administered   Tdap 08/30/2017   Past Medical History:  Diagnosis Date   Cancer (HCC)    cervical at age 40    Hyperlipidemia    Social History   Socioeconomic History   Marital status: Married    Spouse name: Not on file   Number of children: Not on file   Years of education: Not on file   Highest education level: Not on file  Occupational History   Not on file  Tobacco Use   Smoking status: Former    Current packs/day: 0.00    Average packs/day: 0.3 packs/day for 10.0 years (2.5 ttl pk-yrs)    Types: Cigarettes    Start date: 09/05/1992    Quit date: 09/06/2002    Years since quitting: 21.8    Passive exposure: Past   Smokeless tobacco: Never  Vaping Use   Vaping status: Never Used  Substance and Sexual Activity   Alcohol use: No   Drug use: No   Sexual activity: Yes    Comment: married 7 years, on daughter and grankids  Other Topics Concern   Not on file  Social History Narrative   Not on file   Social Drivers of Health   Tobacco Use: Medium Risk (06/22/2024)   Patient History    Smoking Tobacco Use: Former    Smokeless Tobacco Use: Never    Passive Exposure: Past  Physicist, Medical Strain: Low Risk (06/21/2023)    Overall Financial Resource Strain (CARDIA)    Difficulty of Paying Living Expenses: Not hard at all  Food Insecurity: No Food Insecurity (06/21/2023)   Hunger Vital Sign    Worried About Running Out of Food in the Last Year: Never true    Ran Out of Food in the Last Year: Never true  Transportation Needs: No Transportation Needs (06/21/2023)   PRAPARE - Administrator, Civil Service (Medical): No    Lack of Transportation (Non-Medical): No  Physical Activity: Sufficiently Active (06/21/2023)   Exercise Vital Sign    Days of Exercise per Week: 7 days    Minutes of Exercise per Session: 40 min  Recent Concern: Physical Activity - Insufficiently Active (06/21/2023)   Exercise Vital Sign    Days of Exercise per Week: 2 days    Minutes of Exercise per Session: 30 min  Stress: No Stress Concern Present (06/21/2023)   [employer] of Occupational Health - Occupational Stress Questionnaire    Feeling of Stress : Not at all  Social Connections: Moderately Isolated (06/21/2023)   Social Connection and Isolation Panel    Frequency of Communication with Friends and Family: Twice a week    Frequency of Social  Gatherings with Friends and Family: Twice a week    Attends Religious Services: Never    Database Administrator or Organizations: No    Attends Banker Meetings: Never    Marital Status: Married  Catering Manager Violence: Not At Risk (06/21/2023)   Humiliation, Afraid, Rape, and Kick questionnaire    Fear of Current or Ex-Partner: No    Emotionally Abused: No    Physically Abused: No    Sexually Abused: No  Depression (PHQ2-9): Low Risk (06/22/2024)   Depression (PHQ2-9)    PHQ-2 Score: 0  Alcohol Screen: Low Risk (06/21/2023)   Alcohol Screen    Last Alcohol Screening Score (AUDIT): 1  Housing: Low Risk (06/21/2023)   Housing Stability Vital Sign    Unable to Pay for Housing in the Last Year: No    Number of Times Moved in the Last Year: 0    Homeless in the  Last Year: No  Utilities: Not At Risk (06/21/2023)   AHC Utilities    Threatened with loss of utilities: No  Health Literacy: Adequate Health Literacy (06/21/2023)   B1300 Health Literacy    Frequency of need for help with medical instructions: Never   Past Surgical History:  Procedure Laterality Date   ABDOMINAL HYSTERECTOMY     JOINT REPLACEMENT     left- leg   Family History  Problem Relation Age of Onset   Dementia Mother    Cancer Father        lymph nodes, non hodgkins lymphoma   Diabetes Father    Breast cancer Neg Hx    Current Medications[1]  Allergies[2]   ROS: Review of Systems Pertinent items noted in HPI and remainder of comprehensive ROS otherwise negative.    Physical exam BP 131/74   Pulse 68   Temp 97.7 F (36.5 C)   Ht 5' 11 (1.803 m)   Wt 162 lb 6 oz (73.7 kg)   SpO2 98%   BMI 22.65 kg/m  General appearance: alert, cooperative, appears stated age, and no distress Head: Normocephalic, without obvious abnormality, atraumatic Eyes: negative findings: lids and lashes normal, conjunctivae and sclerae normal, corneas clear, and pupils equal, round, reactive to light and accomodation Ears: normal TM's and external ear canals both ears Nose: Nares normal. Septum midline. Mucosa normal. No drainage or sinus tenderness. Throat: lips, mucosa, and tongue normal; teeth and gums normal Neck: no adenopathy, no carotid bruit, supple, symmetrical, trachea midline, and thyroid not enlarged, symmetric, no tenderness/mass/nodules Back: symmetric, no curvature. ROM normal. No CVA tenderness. Lungs: clear to auscultation bilaterally Heart: regular rate and rhythm, S1, S2 normal, no murmur, click, rub or gallop Abdomen: soft, non-tender; bowel sounds normal; no masses,  no organomegaly Extremities: extremities normal, atraumatic, no cyanosis or edema Pulses: 2+ and symmetric Skin: Small amounts of dry patches along the forehead and cheeks.  She has varicose veins along  the legs bilaterally, particular around the ankles and feet.  Healed stasis ulcer noted along the medial left ankle Lymph nodes: No supraclavicular or anterior cervical lymph node enlargement Neurologic: Alert and oriented X 3, normal strength and tone. Normal symmetric reflexes. Normal coordination and gait      06/22/2024    1:11 PM 08/16/2023   10:09 AM 06/21/2023   10:25 AM  Depression screen PHQ 2/9  Decreased Interest 0 0 0  Down, Depressed, Hopeless 0 0 0  PHQ - 2 Score 0 0 0  Altered sleeping 0 0 0  Tired, decreased  energy 0 0 0  Change in appetite 0 0 0  Feeling bad or failure about yourself  0 0 0  Trouble concentrating 0 0 0  Moving slowly or fidgety/restless 0 0 0  Suicidal thoughts 0 0 0  PHQ-9 Score 0 0  0   Difficult doing work/chores Not difficult at all Not difficult at all Not difficult at all     Data saved with a previous flowsheet row definition      06/22/2024    1:12 PM 08/16/2023   10:09 AM 06/21/2023   10:25 AM 04/11/2023    8:46 AM  GAD 7 : Generalized Anxiety Score  Nervous, Anxious, on Edge 0 0 0 0  Control/stop worrying 0 0 0 0  Worry too much - different things 0 0 0 0  Trouble relaxing 0 0 0 0  Restless 0 0 0 0  Easily annoyed or irritable 0 0 0 0  Afraid - awful might happen 0 0 0 0  Total GAD 7 Score 0 0 0 0  Anxiety Difficulty Not difficult at all Not difficult at all Not difficult at all Not difficult at all    No results found for this or any previous visit (from the past 2160 hours).   Assessment/ Plan: Mikaiah G Rhodus here for annual physical exam.   Annual physical exam  Dry skin  Osteopenia with high risk of fracture - Plan: CMP14+EGFR, VITAMIN D  25 Hydroxy (Vit-D Deficiency, Fractures), CBC with Differential  Vitamin D  deficiency - Plan: CMP14+EGFR, VITAMIN D  25 Hydroxy (Vit-D Deficiency, Fractures), CBC with Differential  Pure hypercholesterolemia - Plan: CMP14+EGFR, Lipid Panel, CBC with Differential, TSH  Need for influenza  vaccination  Need for pneumococcal vaccination   Discussed proper skin care.  Handout provided.  Samples of CeraVe facial wash and moisturizer provided today  Check vitamin D , calcium and renal function given osteopenia with high fracture risk.  Encouraged balanced diet, regular exercise to prevent progression of osteopenia  Nonfasting labs collected today.  Pneumococcal and influenza vaccinations administered.  She declined shingles  Counseled on healthy lifestyle choices, including diet (rich in fruits, vegetables and lean meats and low in salt and simple carbohydrates) and exercise (at least 30 minutes of moderate physical activity daily).  Patient to follow up 1 year for CPE  Gisel Vipond M. Lilyana Lippman, DO        [1]  Current Outpatient Medications:    fluticasone  (FLONASE ) 50 MCG/ACT nasal spray, Place 2 sprays into both nostrils daily. (Patient not taking: Reported on 06/22/2024), Disp: 16 g, Rfl: 6 [2] No Known Allergies  "

## 2024-06-22 NOTE — Addendum Note (Signed)
 Addended by: SHERRE SUZEN PARAS on: 06/22/2024 01:33 PM   Modules accepted: Orders

## 2024-06-22 NOTE — Patient Instructions (Signed)
Basic Skin Care Your skin plays an important role in keeping the entire body healthy.  Below are some tips on how to try and maximize skin health from the outside in.  Bathe in mildly warm water every 1 to 3 days, followed by light drying and an application of a thick moisturizer cream or ointment, preferably one that comes in a tub. Fragrance free moisturizing bars or body washes are preferred such as Purpose, Cetaphil, Dove sensitive skin, Aveeno, [place] Baby or Vanicream products. Use a fragrance free cream or ointment, not a lotion, such as plain petroleum jelly or Vaseline ointment, Aquaphor, Vanicream, Eucerin cream or a generic version, CeraVe Cream, Cetaphil Restoraderm, Aveeno Eczema Therapy and [place] Baby Calming, among others. Children with very dry skin often need to put on these creams two, three or four times a day.  As much as possible, use these creams enough to keep the skin from looking dry. Consider using fragrance free/dye free detergent, such as Arm and Hammer for sensitive skin, Tide Free or All Free.   If I am prescribing a medication to go on the skin, the medicine goes on first to the areas that need it, followed by a thick cream as above to the entire body.  Sun is a major cause of damage to the skin. I recommend sun protection for all of my patients. I prefer physical barriers such as hats with wide brims that cover the ears, long sleeve clothing with SPF protection including rash guards for swimming. These can be found seasonally at outdoor clothing companies, [employer] and [employer] and online at www.coolibar.com, www.uvskinz.com and www.sunprecautions.com. Avoid peak sun between the hours of 10am to 3pm to minimize sun exposure.  I recommend sunscreen for all of my patients older than 6 months of age when in the sun, preferably with broad spectrum coverage and SPF 30 or higher.  For children, I recommend sunscreens that only contain titanium dioxide and/or zinc oxide  in the active ingredients. These do not burn the eyes and appear to be safer than chemical sunscreens. These sunscreens include zinc oxide paste found in the diaper section, Vanicream Broad Spectrum 50+, Aveeno Natural Mineral Protection, Neutrogena Pure and Free Baby, Johnson and Johnson Baby Daily face and body lotion, [place] Baby products, among others. There is no such thing as waterproof sunscreen. All sunscreens should be reapplied after 60-80 minutes of wear.  Spray on sunscreens often use chemical sunscreens which do protect against the sun. However, these can be difficult to apply correctly, especially if wind is present, and can be more likely to irritate the skin.  Long term effects of chemical sunscreens are also not fully known.     

## 2024-06-23 ENCOUNTER — Ambulatory Visit: Payer: Self-pay | Admitting: Family Medicine

## 2024-06-23 LAB — LIPID PANEL
Cholesterol, Total: 198 mg/dL (ref 100–199)
HDL: 78 mg/dL
LDL CALC COMMENT:: 2.5 ratio (ref 0.0–4.4)
LDL Chol Calc (NIH): 109 mg/dL — AB (ref 0–99)
Triglycerides: 62 mg/dL (ref 0–149)
VLDL Cholesterol Cal: 11 mg/dL (ref 5–40)

## 2024-06-23 LAB — CMP14+EGFR
ALT: 12 IU/L (ref 0–32)
AST: 16 IU/L (ref 0–40)
Albumin: 4.5 g/dL (ref 3.9–4.9)
Alkaline Phosphatase: 132 IU/L (ref 49–135)
BUN/Creatinine Ratio: 18 (ref 12–28)
BUN: 13 mg/dL (ref 8–27)
Bilirubin Total: 0.3 mg/dL (ref 0.0–1.2)
CO2: 22 mmol/L (ref 20–29)
Calcium: 9.6 mg/dL (ref 8.7–10.3)
Chloride: 103 mmol/L (ref 96–106)
Creatinine, Ser: 0.74 mg/dL (ref 0.57–1.00)
Globulin, Total: 2.6 g/dL (ref 1.5–4.5)
Glucose: 98 mg/dL (ref 70–99)
Potassium: 4.9 mmol/L (ref 3.5–5.2)
Sodium: 143 mmol/L (ref 134–144)
Total Protein: 7.1 g/dL (ref 6.0–8.5)
eGFR: 89 mL/min/1.73

## 2024-06-23 LAB — CBC WITH DIFFERENTIAL/PLATELET
Basophils Absolute: 0.1 x10E3/uL (ref 0.0–0.2)
Basos: 1 %
EOS (ABSOLUTE): 0.3 x10E3/uL (ref 0.0–0.4)
Eos: 4 %
Hematocrit: 40.2 % (ref 34.0–46.6)
Hemoglobin: 13.3 g/dL (ref 11.1–15.9)
Immature Grans (Abs): 0 x10E3/uL (ref 0.0–0.1)
Immature Granulocytes: 0 %
Lymphocytes Absolute: 1.5 x10E3/uL (ref 0.7–3.1)
Lymphs: 18 %
MCH: 30.6 pg (ref 26.6–33.0)
MCHC: 33.1 g/dL (ref 31.5–35.7)
MCV: 92 fL (ref 79–97)
Monocytes Absolute: 0.5 x10E3/uL (ref 0.1–0.9)
Monocytes: 6 %
Neutrophils Absolute: 5.9 x10E3/uL (ref 1.4–7.0)
Neutrophils: 71 %
Platelets: 212 x10E3/uL (ref 150–450)
RBC: 4.35 x10E6/uL (ref 3.77–5.28)
RDW: 12.5 % (ref 11.7–15.4)
WBC: 8.3 x10E3/uL (ref 3.4–10.8)

## 2024-06-23 LAB — TSH: TSH: 2.82 u[IU]/mL (ref 0.450–4.500)

## 2024-06-23 LAB — VITAMIN D 25 HYDROXY (VIT D DEFICIENCY, FRACTURES): Vit D, 25-Hydroxy: 31.4 ng/mL (ref 30.0–100.0)

## 2024-07-02 ENCOUNTER — Ambulatory Visit

## 2024-07-02 VITALS — BP 131/74 | HR 68 | Ht 71.0 in | Wt 162.0 lb

## 2024-07-02 DIAGNOSIS — Z0000 Encounter for general adult medical examination without abnormal findings: Secondary | ICD-10-CM | POA: Diagnosis not present

## 2024-07-02 NOTE — Patient Instructions (Signed)
 Ms. Kever,  Thank you for taking the time for your Medicare Wellness Visit. I appreciate your continued commitment to your health goals. Please review the care plan we discussed, and feel free to reach out if I can assist you further.  Please note that Annual Wellness Visits do not include a physical exam. Some assessments may be limited, especially if the visit was conducted virtually. If needed, we may recommend an in-person follow-up with your provider.  Ongoing Care Seeing your primary care provider every 3 to 6 months helps us  monitor your health and provide consistent, personalized care.   Referrals If a referral was made during today's visit and you haven't received any updates within two weeks, please contact the referred provider directly to check on the status.  Recommended Screenings:  Health Maintenance  Topic Date Due   Medicare Annual Wellness Visit  06/20/2024   COVID-19 Vaccine (1 - 2025-26 season) 07/08/2024*   Zoster (Shingles) Vaccine (1 of 2) 09/20/2024*   Osteoporosis screening with Bone Density Scan  06/20/2025   Breast Cancer Screening  07/30/2025   Cologuard (Stool DNA test)  06/25/2026   DTaP/Tdap/Td vaccine (2 - Td or Tdap) 08/31/2027   Pneumococcal Vaccine for age over 42  Completed   Flu Shot  Completed   Hepatitis C Screening  Completed   Meningitis B Vaccine  Aged Out  *Topic was postponed. The date shown is not the original due date.       07/02/2024   12:44 PM  Advanced Directives  Does Patient Have a Medical Advance Directive? No  Would patient like information on creating a medical advance directive? No - Patient declined    Vision: Annual vision screenings are recommended for early detection of glaucoma, cataracts, and diabetic retinopathy. These exams can also reveal signs of chronic conditions such as diabetes and high blood pressure.  Dental: Annual dental screenings help detect early signs of oral cancer, gum disease, and other conditions  linked to overall health, including heart disease and diabetes.  Please see the attached documents for additional preventive care recommendations.

## 2024-07-02 NOTE — Progress Notes (Signed)
 "  Chief Complaint  Patient presents with   Medicare Wellness     Subjective:   Tracie Noble is a 70 y.o. female who presents for a Medicare Annual Wellness Visit.  Visit info / Clinical Intake: Persons participating in visit and providing information:: patient Medicare Wellness Visit Mode:: Telephone If telephone:: video declined Since this visit was completed virtually, some vitals may be partially provided or unavailable. Missing vitals are due to the limitations of the virtual format.: Unable to obtain vitals - no equipment If Telephone or Video please confirm:: I connected with patient using audio/video enable telemedicine. I verified patient identity with two identifiers, discussed telehealth limitations, and patient agreed to proceed. Patient Location:: home Provider Location:: home office Interpreter Needed?: No Pre-visit prep was completed: yes AWV questionnaire completed by patient prior to visit?: no Living arrangements:: lives with spouse/significant other Patient's Overall Health Status Rating: very good Typical amount of pain: none Does pain affect daily life?: no Are you currently prescribed opioids?: no  Dietary Habits and Nutritional Risks How many meals a day?: 2 Eats fruit and vegetables daily?: yes Most meals are obtained by: preparing own meals In the last 2 weeks, have you had any of the following?: none Diabetic:: no  Functional Status Activities of Daily Living (to include ambulation/medication): Independent Ambulation: Independent Medication Administration: Independent Home Management (perform basic housework or laundry): Independent Manage your own finances?: (!) no Primary transportation is: driving Concerns about vision?: no *vision screening is required for WTM* (last ov 2025) Concerns about hearing?: no  Fall Screening Falls in the past year?: 0 Number of falls in past year: 0 Was there an injury with Fall?: 0 Fall Risk Category  Calculator: 0 Patient Fall Risk Level: Low Fall Risk  Fall Risk Patient at Risk for Falls Due to: No Fall Risks Fall risk Follow up: Falls evaluation completed; Education provided  Home and Transportation Safety: All rugs have non-skid backing?: yes All stairs or steps have railings?: yes Grab bars in the bathtub or shower?: (!) no Have non-skid surface in bathtub or shower?: yes Good home lighting?: yes Regular seat belt use?: yes Hospital stays in the last year:: no  Cognitive Assessment Difficulty concentrating, remembering, or making decisions? : no Will 6CIT or Mini Cog be Completed: yes What year is it?: 0 points What month is it?: 0 points Give patient an address phrase to remember (5 components): [address] Tracie Noble About what time is it?: 0 points Count backwards from 20 to 1: 0 points Say the months of the year in reverse: 0 points Repeat the address phrase from earlier: 0 points 6 CIT Score: 0 points  Advance Directives (For Healthcare) Does Patient Have a Medical Advance Directive?: No Would patient like information on creating a medical advance directive?: No - Patient declined  Reviewed/Updated  Reviewed/Updated: Reviewed All (Medical, Surgical, Family, Medications, Allergies, Care Teams, Patient Goals)    Allergies (verified) Patient has no known allergies.   Current Medications (verified) Outpatient Encounter Medications as of 07/02/2024  Medication Sig   fluticasone  (FLONASE ) 50 MCG/ACT nasal spray Place 2 sprays into both nostrils daily. (Patient not taking: Reported on 07/02/2024)   No facility-administered encounter medications on file as of 07/02/2024.    History: Past Medical History:  Diagnosis Date   Cancer (HCC)    cervical at age 55    Hyperlipidemia    Past Surgical History:  Procedure Laterality Date   ABDOMINAL HYSTERECTOMY     JOINT REPLACEMENT  left- leg   Family History  Problem Relation Age of Onset   Dementia Mother     Cancer Father        lymph nodes, non hodgkins lymphoma   Diabetes Father    Breast cancer Neg Hx    Social History   Occupational History   Not on file  Tobacco Use   Smoking status: Former    Current packs/day: 0.00    Average packs/day: 0.3 packs/day for 10.0 years (2.5 ttl pk-yrs)    Types: Cigarettes    Start date: 09/05/1992    Quit date: 09/06/2002    Years since quitting: 21.8    Passive exposure: Past   Smokeless tobacco: Never  Vaping Use   Vaping status: Never Used  Substance and Sexual Activity   Alcohol use: No   Drug use: No   Sexual activity: Yes    Comment: married 7 years, on daughter and grankids   Tobacco Counseling Counseling given: Yes  SDOH Screenings   Food Insecurity: No Food Insecurity (07/02/2024)  Housing: Low Risk (07/02/2024)  Transportation Needs: No Transportation Needs (07/02/2024)  Utilities: Not At Risk (07/02/2024)  Alcohol Screen: Low Risk (06/21/2023)  Depression (PHQ2-9): Low Risk (07/02/2024)  Financial Resource Strain: Low Risk (06/21/2023)  Physical Activity: Sufficiently Active (07/02/2024)  Social Connections: Moderately Isolated (07/02/2024)  Stress: No Stress Concern Present (07/02/2024)  Tobacco Use: Medium Risk (07/02/2024)  Health Literacy: Adequate Health Literacy (07/02/2024)   See flowsheets for full screening details  Depression Screen PHQ 2 & 9 Depression Scale- Over the past 2 weeks, how often have you been bothered by any of the following problems? Little interest or pleasure in doing things: 0 Feeling down, depressed, or hopeless (PHQ Adolescent also includes...irritable): 0 PHQ-2 Total Score: 0 Trouble falling or staying asleep, or sleeping too much: 0 Feeling tired or having little energy: 0 Poor appetite or overeating (PHQ Adolescent also includes...weight loss): 0 Feeling bad about yourself - or that you are a failure or have let yourself or your family down: 0 Trouble concentrating on things, such as reading the  newspaper or watching television (PHQ Adolescent also includes...like school work): 0 Moving or speaking so slowly that other people could have noticed. Or the opposite - being so fidgety or restless that you have been moving around a lot more than usual: 0 Thoughts that you would be better off dead, or of hurting yourself in some way: 0 PHQ-9 Total Score: 0 If you checked off any problems, how difficult have these problems made it for you to do your work, take care of things at home, or get along with other people?: Not difficult at all     Goals Addressed             This Visit's Progress    Exercise 150 min/wk Moderate Activity   On track            Objective:    Today's Vitals   07/02/24 1253  BP: 131/74  Pulse: 68  Weight: 162 lb (73.5 kg)  Height: 5' 11 (1.803 m)   Body mass index is 22.59 kg/m.  Hearing/Vision screen No results found. Immunizations and Health Maintenance Health Maintenance  Topic Date Due   COVID-19 Vaccine (1 - 2025-26 season) 07/08/2024 (Originally 02/03/2024)   Zoster Vaccines- Shingrix (1 of 2) 09/20/2024 (Originally 09/15/2007)   Bone Density Scan  06/20/2025   Medicare Annual Wellness (AWV)  07/02/2025   Mammogram  07/30/2025   Fecal DNA (  Cologuard)  06/25/2026   DTaP/Tdap/Td (2 - Td or Tdap) 08/31/2027   Pneumococcal Vaccine: 50+ Years  Completed   Influenza Vaccine  Completed   Hepatitis C Screening  Completed   Meningococcal B Vaccine  Aged Out        Assessment/Plan:  This is a routine wellness examination for Tracie Noble.  Patient Care Team: Jolinda Norene HERO, DO as PCP - General (Family Medicine) Dr. Vicci as Consulting Physician (Optometry)  I have personally reviewed and noted the following in the patients chart:   Medical and social history Use of alcohol, tobacco or illicit drugs  Current medications and supplements including opioid prescriptions. Functional ability and status Nutritional status Physical  activity Advanced directives List of other physicians Hospitalizations, surgeries, and ER visits in previous 12 months Vitals Screenings to include cognitive, depression, and falls Referrals and appointments  No orders of the defined types were placed in this encounter.  In addition, I have reviewed and discussed with patient certain preventive protocols, quality metrics, and best practice recommendations. A written personalized care plan for preventive services as well as general preventive health recommendations were provided to patient.   Tracie Noble, CMA   07/02/2024   Return in 1 year (on 07/02/2025).  After Visit Summary: (MyChart) Due to this being a telephonic visit, the after visit summary with patients personalized plan was offered to patient via MyChart   Nurse Notes: n/a "

## 2024-08-10 ENCOUNTER — Encounter

## 2025-06-23 ENCOUNTER — Encounter: Admitting: Family Medicine
